# Patient Record
Sex: Female | Born: 1935
Health system: Southern US, Community
[De-identification: ages and names within clinical notes are randomized; demographics above are authoritative.]

## PROBLEM LIST (undated history)

## (undated) DIAGNOSIS — R011 Cardiac murmur, unspecified: Secondary | ICD-10-CM

## (undated) DIAGNOSIS — I5032 Chronic diastolic (congestive) heart failure: Secondary | ICD-10-CM

## (undated) DIAGNOSIS — I10 Essential (primary) hypertension: Secondary | ICD-10-CM

## (undated) DIAGNOSIS — M810 Age-related osteoporosis without current pathological fracture: Secondary | ICD-10-CM

## (undated) HISTORY — DX: Age-related osteoporosis without current pathological fracture: M81.0

## (undated) HISTORY — DX: Chronic diastolic (congestive) heart failure: I50.32

## (undated) HISTORY — PX: CATARACT EXTRACTION: SUR2

---

## 2012-05-10 DIAGNOSIS — I10 Essential (primary) hypertension: Secondary | ICD-10-CM | POA: Diagnosis not present

## 2012-05-10 DIAGNOSIS — M6281 Muscle weakness (generalized): Secondary | ICD-10-CM | POA: Diagnosis not present

## 2012-05-10 DIAGNOSIS — M810 Age-related osteoporosis without current pathological fracture: Secondary | ICD-10-CM | POA: Diagnosis not present

## 2012-05-10 DIAGNOSIS — Z1331 Encounter for screening for depression: Secondary | ICD-10-CM | POA: Diagnosis not present

## 2012-05-10 DIAGNOSIS — Z0000 Encounter for general adult medical examination without abnormal findings: Secondary | ICD-10-CM | POA: Diagnosis not present

## 2012-05-30 DIAGNOSIS — M810 Age-related osteoporosis without current pathological fracture: Secondary | ICD-10-CM | POA: Diagnosis not present

## 2012-08-02 DIAGNOSIS — R29898 Other symptoms and signs involving the musculoskeletal system: Secondary | ICD-10-CM | POA: Diagnosis not present

## 2012-08-02 DIAGNOSIS — Z23 Encounter for immunization: Secondary | ICD-10-CM | POA: Diagnosis not present

## 2012-08-09 DIAGNOSIS — M25559 Pain in unspecified hip: Secondary | ICD-10-CM | POA: Diagnosis not present

## 2012-08-14 DIAGNOSIS — M25559 Pain in unspecified hip: Secondary | ICD-10-CM | POA: Diagnosis not present

## 2012-08-17 DIAGNOSIS — M25559 Pain in unspecified hip: Secondary | ICD-10-CM | POA: Diagnosis not present

## 2012-08-21 DIAGNOSIS — M25559 Pain in unspecified hip: Secondary | ICD-10-CM | POA: Diagnosis not present

## 2012-08-24 DIAGNOSIS — M25559 Pain in unspecified hip: Secondary | ICD-10-CM | POA: Diagnosis not present

## 2012-08-28 DIAGNOSIS — M25559 Pain in unspecified hip: Secondary | ICD-10-CM | POA: Diagnosis not present

## 2012-08-30 DIAGNOSIS — Z23 Encounter for immunization: Secondary | ICD-10-CM | POA: Diagnosis not present

## 2012-08-31 DIAGNOSIS — M25559 Pain in unspecified hip: Secondary | ICD-10-CM | POA: Diagnosis not present

## 2012-09-11 DIAGNOSIS — M25559 Pain in unspecified hip: Secondary | ICD-10-CM | POA: Diagnosis not present

## 2012-09-16 DIAGNOSIS — R5381 Other malaise: Secondary | ICD-10-CM | POA: Diagnosis not present

## 2012-09-16 DIAGNOSIS — R011 Cardiac murmur, unspecified: Secondary | ICD-10-CM | POA: Diagnosis not present

## 2012-09-16 DIAGNOSIS — R0602 Shortness of breath: Secondary | ICD-10-CM | POA: Diagnosis not present

## 2012-09-16 DIAGNOSIS — J189 Pneumonia, unspecified organism: Secondary | ICD-10-CM | POA: Diagnosis not present

## 2012-09-16 DIAGNOSIS — R630 Anorexia: Secondary | ICD-10-CM | POA: Diagnosis not present

## 2012-09-16 DIAGNOSIS — J90 Pleural effusion, not elsewhere classified: Secondary | ICD-10-CM | POA: Diagnosis not present

## 2012-09-18 DIAGNOSIS — R011 Cardiac murmur, unspecified: Secondary | ICD-10-CM | POA: Diagnosis not present

## 2012-09-18 DIAGNOSIS — J189 Pneumonia, unspecified organism: Secondary | ICD-10-CM | POA: Diagnosis not present

## 2012-09-21 ENCOUNTER — Emergency Department (HOSPITAL_COMMUNITY): Payer: Medicare Other

## 2012-09-21 ENCOUNTER — Inpatient Hospital Stay (HOSPITAL_COMMUNITY): Payer: Medicare Other

## 2012-09-21 ENCOUNTER — Inpatient Hospital Stay (HOSPITAL_COMMUNITY)
Admission: EM | Admit: 2012-09-21 | Discharge: 2012-09-24 | DRG: 291 | Disposition: A | Discharge status: Home Health | Payer: Medicare Other | Attending: Internal Medicine | Admitting: Internal Medicine

## 2012-09-21 ENCOUNTER — Encounter (HOSPITAL_COMMUNITY): Payer: Self-pay | Admitting: Nurse Practitioner

## 2012-09-21 DIAGNOSIS — Z79899 Other long term (current) drug therapy: Secondary | ICD-10-CM

## 2012-09-21 DIAGNOSIS — J9600 Acute respiratory failure, unspecified whether with hypoxia or hypercapnia: Secondary | ICD-10-CM | POA: Diagnosis not present

## 2012-09-21 DIAGNOSIS — E876 Hypokalemia: Secondary | ICD-10-CM

## 2012-09-21 DIAGNOSIS — I10 Essential (primary) hypertension: Secondary | ICD-10-CM | POA: Diagnosis not present

## 2012-09-21 DIAGNOSIS — R609 Edema, unspecified: Secondary | ICD-10-CM

## 2012-09-21 DIAGNOSIS — R059 Cough, unspecified: Secondary | ICD-10-CM | POA: Diagnosis not present

## 2012-09-21 DIAGNOSIS — J90 Pleural effusion, not elsewhere classified: Secondary | ICD-10-CM | POA: Diagnosis not present

## 2012-09-21 DIAGNOSIS — J189 Pneumonia, unspecified organism: Secondary | ICD-10-CM | POA: Diagnosis not present

## 2012-09-21 DIAGNOSIS — I5031 Acute diastolic (congestive) heart failure: Principal | ICD-10-CM | POA: Diagnosis present

## 2012-09-21 DIAGNOSIS — I059 Rheumatic mitral valve disease, unspecified: Secondary | ICD-10-CM | POA: Diagnosis not present

## 2012-09-21 DIAGNOSIS — I509 Heart failure, unspecified: Secondary | ICD-10-CM | POA: Diagnosis present

## 2012-09-21 DIAGNOSIS — J984 Other disorders of lung: Secondary | ICD-10-CM | POA: Diagnosis not present

## 2012-09-21 DIAGNOSIS — R05 Cough: Secondary | ICD-10-CM | POA: Diagnosis not present

## 2012-09-21 DIAGNOSIS — J9819 Other pulmonary collapse: Secondary | ICD-10-CM | POA: Diagnosis not present

## 2012-09-21 HISTORY — DX: Cardiac murmur, unspecified: R01.1

## 2012-09-21 HISTORY — DX: Essential (primary) hypertension: I10

## 2012-09-21 LAB — CBC
HCT: 35.1 % — ABNORMAL LOW (ref 36.0–46.0)
HCT: 33.2 % — ABNORMAL LOW (ref 36.0–46.0)
Hemoglobin: 11.5 g/dL — ABNORMAL LOW (ref 12.0–15.0)
MCHC: 34.6 g/dL (ref 30.0–36.0)
MCV: 88 fL (ref 78.0–100.0)
RBC: 3.99 MIL/uL (ref 3.87–5.11)
RDW: 13.8 % (ref 11.5–15.5)
WBC: 8.5 10*3/uL (ref 4.0–10.5)
WBC: 8.2 10*3/uL (ref 4.0–10.5)

## 2012-09-21 LAB — CREATININE, SERUM
Creatinine, Ser: 0.74 mg/dL (ref 0.50–1.10)
GFR calc Af Amer: 85 mL/min — ABNORMAL LOW (ref >90)
GFR calc non Af Amer: 73 mL/min — ABNORMAL LOW (ref >90)

## 2012-09-21 LAB — URINALYSIS, ROUTINE W REFLEX MICROSCOPIC
Bilirubin Urine: NEGATIVE
Glucose, UA: NEGATIVE mg/dL
Hgb urine dipstick: NEGATIVE
Nitrite: NEGATIVE
Specific Gravity, Urine: 1.007 (ref 1.005–1.030)
pH: 7 (ref 5.0–8.0)

## 2012-09-21 LAB — BASIC METABOLIC PANEL
BUN: 19 mg/dL (ref 6–23)
CO2: 21 mEq/L (ref 19–32)
Chloride: 101 mEq/L (ref 96–112)
Creatinine, Ser: 0.71 mg/dL (ref 0.50–1.10)
Glucose, Bld: 128 mg/dL — ABNORMAL HIGH (ref 70–99)

## 2012-09-21 MED ORDER — ACETAMINOPHEN 325 MG PO TABS: 650.0000 mg | ORAL_TABLET | ORAL | Status: DC | PRN

## 2012-09-21 MED ORDER — ATORVASTATIN CALCIUM 40 MG PO TABS
40.0000 mg | ORAL_TABLET | Freq: Every day | ORAL | Status: DC
  Administered 2012-09-21 – 2012-09-24 (×4): 40 mg via ORAL
  Filled 2012-09-21 (×4): qty 1

## 2012-09-21 MED ORDER — IRBESARTAN 150 MG PO TABS
150.0000 mg | ORAL_TABLET | Freq: Every day | ORAL | Status: DC
  Administered 2012-09-21 – 2012-09-24 (×4): 150 mg via ORAL
  Filled 2012-09-21 (×4): qty 1

## 2012-09-21 MED ORDER — MOXIFLOXACIN HCL 400 MG PO TABS
400.0000 mg | ORAL_TABLET | Freq: Every day | ORAL | Status: AC
  Administered 2012-09-21: 400 mg via ORAL
  Filled 2012-09-21: qty 1

## 2012-09-21 MED ORDER — ASPIRIN EC 81 MG PO TBEC
81.0000 mg | DELAYED_RELEASE_TABLET | Freq: Every day | ORAL | Status: DC
  Administered 2012-09-21 – 2012-09-24 (×4): 81 mg via ORAL
  Filled 2012-09-21 (×4): qty 1

## 2012-09-21 MED ORDER — SODIUM CHLORIDE 0.9 % IJ SOLN: 3.0000 mL | INTRAMUSCULAR | Status: DC | PRN

## 2012-09-21 MED ORDER — ONDANSETRON HCL 4 MG/2ML IJ SOLN: 4.0000 mg | Freq: Four times a day (QID) | INTRAMUSCULAR | Status: DC | PRN

## 2012-09-21 MED ORDER — HEPARIN SODIUM (PORCINE) 5000 UNIT/ML IJ SOLN
5000.0000 [IU] | Freq: Three times a day (TID) | INTRAMUSCULAR | Status: DC
  Administered 2012-09-21 – 2012-09-24 (×9): 5000 [IU] via SUBCUTANEOUS
  Filled 2012-09-21 (×11): qty 1

## 2012-09-21 MED ORDER — SODIUM CHLORIDE 0.9 % IJ SOLN
3.0000 mL | Freq: Two times a day (BID) | INTRAMUSCULAR | Status: DC
  Administered 2012-09-21 – 2012-09-24 (×6): 3 mL via INTRAVENOUS

## 2012-09-21 MED ORDER — FUROSEMIDE 10 MG/ML IJ SOLN
40.0000 mg | Freq: Two times a day (BID) | INTRAMUSCULAR | Status: DC
  Administered 2012-09-21 – 2012-09-22 (×2): 40 mg via INTRAVENOUS
  Filled 2012-09-21 (×4): qty 4

## 2012-09-21 MED ORDER — CARVEDILOL 3.125 MG PO TABS
3.1250 mg | ORAL_TABLET | Freq: Two times a day (BID) | ORAL | Status: DC
  Administered 2012-09-22 – 2012-09-23 (×3): 3.125 mg via ORAL
  Filled 2012-09-21 (×4): qty 1

## 2012-09-21 MED ORDER — IOHEXOL 350 MG/ML SOLN
80.0000 mL | Freq: Once | INTRAVENOUS | Status: AC | PRN
  Administered 2012-09-21: 80 mL via INTRAVENOUS

## 2012-09-21 MED ORDER — FUROSEMIDE 10 MG/ML IJ SOLN
40.0000 mg | Freq: Once | INTRAMUSCULAR | Status: AC
  Administered 2012-09-21: 40 mg via INTRAVENOUS
  Filled 2012-09-21: qty 4

## 2012-09-21 MED ORDER — SODIUM CHLORIDE 0.9 % IV SOLN: 250.0000 mL | INTRAVENOUS | Status: DC | PRN

## 2012-09-21 NOTE — ED Notes (Signed)
Pt requesting foley catheter, too weak to get to [facility] or lift for a bedpan

## 2012-09-21 NOTE — ED Provider Notes (Signed)
History     CSN: [id number]  Arrival date & time 09/21/12  1304   First MD Initiated Contact with Patient 09/21/12 1323      Chief Complaint  Patient presents with  . Pneumonia    (Consider location/radiation/quality/duration/timing/severity/associated sxs/prior treatment) HPI Pt presents with c/o shortness of breath.  This has been worsening over the past week.  She was seen by her PMD and diagnosed with pneumonia- started on antibiotics- she has been taking meds as prescribed, but today shortness of breath much worse.  She has felt fatigued with decreased appetite.  SOB worse with exertion and lying flat.  No fever/chills.  Has not been drinking much liquids over past several days as well.  Denies chest pain.  There are no other associated systemic symptoms, there are no other alleviating or modifying factors.   Past Medical History  Diagnosis Date  . Heart murmur   . Hypertension     History reviewed. No pertinent past surgical history.  Family History  Problem Relation Age of Onset  . Dementia Mother   . Lung cancer Father     History  Substance Use Topics  . Smoking status: Never Smoker   . Smokeless tobacco: Not on file  . Alcohol Use: No    OB History    Grav Para Term Preterm Abortions TAB SAB Ect Mult Living                  Review of Systems ROS reviewed and all otherwise negative except for mentioned in HPI  Allergies  Review of patient's allergies indicates no known allergies.  Home Medications   Current Outpatient Rx  Name Route Sig Dispense Refill  . CALCIUM 1200 PO Oral Take 1,200 mg by mouth daily.    Marland Kitchen VITAMIN D 1000 UNITS PO TABS Oral Take 1,000 Units by mouth daily.    Marland Kitchen MOXIFLOXACIN HCL 400 MG PO TABS Oral Take 400 mg by mouth daily. For 10 days starting on 09/17/12    . ADULT MULTIVITAMIN W/MINERALS CH Oral Take 1 tablet by mouth daily.    . TELMISARTAN 80 MG PO TABS Oral Take 80 mg by mouth daily.    . ASPIRIN 81 MG PO TBEC Oral Take  1 tablet (81 mg total) by mouth daily.    Marland Kitchen BENZONATATE 100 MG PO CAPS Oral Take 1 capsule (100 mg total) by mouth 3 (three) times daily as needed for cough. 20 capsule 0  . FUROSEMIDE 20 MG PO TABS Oral Take 1 tablet (20 mg total) by mouth daily. 30 tablet 0  . POTASSIUM CHLORIDE ER 10 MEQ PO TBCR Oral Take 2 tablets (20 mEq total) by mouth daily. 30 tablet 0    BP 126/62  Pulse 93  Temp 98.1 F (36.7 C) (Oral)  Resp 19  Ht 4\' 5"  (1.346 m)  Wt 115 lb 8 oz (52.39 kg)  BMI 28.91 kg/m2  SpO2 97% Vitals reviewed Physical Exam Physical Examination: General appearance - alert, tired appearing appearing, and in no distress Mental status - alert, oriented to person, place, and time Eyes - pupils equal and reactive, no conjunctival injection, no scleral icterus Mouth - mucous membranes tacky, no OP lesions Chest - clear to auscultation, no wheezes, rales or rhonchi, symmetric air entry Heart - normal rate, regular rhythm, normal S1, S2, no murmurs, rubs, clicks or gallops Abdomen - soft, nontender, nondistended, no masses or organomegaly Extremities - peripheral pulses normal, no pedal edema, no clubbing or cyanosis  Skin - normal coloration and turgor, no rashes  ED Course  Procedures (including critical care time)  3:47 PM  D/w [facility] hospital[facility] for admission- tele bed, team 4.    Labs Reviewed  PRO B NATRIURETIC PEPTIDE - Abnormal; Notable for the following:    Pro B Natriuretic peptide (BNP) 1973.0 (*)     All other components within normal limits  CBC - Abnormal; Notable for the following:    Hemoglobin 11.8 (*)     HCT 35.1 (*)     All other components within normal limits  BASIC METABOLIC PANEL - Abnormal; Notable for the following:    Sodium 133 (*)     Glucose, Bld 128 (*)     GFR calc non Af Amer 74 (*)     GFR calc Af Amer 86 (*)     All other components within normal limits  D-DIMER, QUANTITATIVE - Abnormal; Notable for the following:    D-Dimer, Quant 1.61 (*)      All other components within normal limits  BASIC METABOLIC PANEL - Abnormal; Notable for the following:    Potassium 3.3 (*)  DELTA CHECK NOTED   Glucose, Bld 102 (*)     Calcium 8.3 (*)     GFR calc non Af Amer 72 (*)     GFR calc Af Amer 83 (*)     All other components within normal limits  CBC - Abnormal; Notable for the following:    RBC 3.78 (*)     Hemoglobin 11.5 (*)     HCT 33.2 (*)     All other components within normal limits  CREATININE, SERUM - Abnormal; Notable for the following:    GFR calc non Af Amer 73 (*)     GFR calc Af Amer 85 (*)     All other components within normal limits  VITAMIN B12 - Abnormal; Notable for the following:    Vitamin B-12 1444 (*)     All other components within normal limits  BASIC METABOLIC PANEL - Abnormal; Notable for the following:    Potassium 2.9 (*)     Glucose, Bld 113 (*)     BUN 26 (*)     Calcium 7.8 (*)     GFR calc non Af Amer 59 (*)     GFR calc Af Amer 68 (*)     All other components within normal limits  CBC - Abnormal; Notable for the following:    RBC 3.83 (*)     Hemoglobin 11.3 (*)     HCT 33.8 (*)     All other components within normal limits  BASIC METABOLIC PANEL - Abnormal; Notable for the following:    Glucose, Bld 112 (*)     BUN 24 (*)     Calcium 8.2 (*)     GFR calc non Af Amer 72 (*)     GFR calc Af Amer 83 (*)     All other components within normal limits  BASIC METABOLIC PANEL - Abnormal; Notable for the following:    Glucose, Bld 111 (*)     Calcium 8.1 (*)     GFR calc non Af Amer 60 (*)     GFR calc Af Amer 69 (*)     All other components within normal limits  CBC - Abnormal; Notable for the following:    Hemoglobin 11.9 (*)     All other components within normal limits  URINALYSIS, ROUTINE W REFLEX MICROSCOPIC  URINE CULTURE  POCT I-STAT TROPONIN I  TSH  TROPONIN I  TROPONIN I  TROPONIN I  LAB REPORT - SCANNED   No results found.   1. Acute respiratory failure   2. HTN  (hypertension)   3. Edema   4. Hypokalemia       MDM  Pt with sob and fatigue worsening over the past week.  cxr shows bilateral pleural effusion, BNP elevated. Pt without hx of CHF.  Doubt pneumonia in this case.  Given lasix 40mg  IV.  Admitted to [facility] for further management.          Ethelda Chick, MD 09/27/12 [phone]

## 2012-09-21 NOTE — H&P (Addendum)
[facility] History and Physical  Andrea [name] ZOX:[id number] DOB: 1920-02-27 DOA: 09/21/2012  Referring physician: Dr. Karma [name] PCP: Andrea Showers, MD    Chief Complaint: Dyspnea  HPI: Andrea [name] is a 78 y.o. female  There is an 78 year old female with past medical history of hypertension that comes in for dyspnea that started 5 days prior to admission. She relates she wants to an urgent care chest x-ray was done and was.nose with pneumonia she was started on azithromycin empirically. Did not improve went to see her primary care Dr. Verl [name] her on Avelox she progressively got worse to the point where she couldn't walk. So she decided to come to the emergency room. She also been complaining of swelling of the feet since Saturday. She denies any fever, or weight loss, no chest pain nausea vomiting or diarrhea. No recent viral. She relates no night sweats infections.  Review of Systems: The patient denies anorexia, fever, weight loss,, vision loss, decreased hearing, hoarseness, chest pain, syncope, dyspnea on exertion,  balance deficits, hemoptysis, abdominal pain, melena, hematochezia, severe indigestion/heartburn, hematuria, incontinence, genital sores, muscle weakness, suspicious skin lesions, transient blindness, difficulty walking, depression, unusual weight change, abnormal bleeding, enlarged lymph nodes, angioedema, and breast masses.    Past Medical History  Diagnosis Date  . Heart murmur   . Hypertension    History reviewed. No pertinent past surgical history. Social History:  reports that she has never smoked. She does not have any smokeless tobacco history on file. She reports that she does not drink alcohol or use illicit drugs. Is at home by herself can perform all her ADLs  No Known Allergies  Family History  Problem Relation Age of Onset  . Dementia Mother   . Lung cancer Father     Prior to Admission medications   Medication Sig Start Date End Date Taking?  Authorizing Provider  Calcium Carbonate-Vit D-Min (CALCIUM 1200 PO) Take 1,200 mg by mouth daily.   Yes Historical Provider, MD  cholecalciferol (VITAMIN D) 1000 UNITS tablet Take 1,000 Units by mouth daily.   Yes Historical Provider, MD  guaiFENesin (MUCINEX) 600 MG 12 hr tablet Take 600 mg by mouth 2 (two) times daily as needed.   Yes Historical Provider, MD  moxifloxacin (AVELOX) 400 MG tablet Take 400 mg by mouth daily. For 10 days starting on 09/17/12   Yes Historical Provider, MD  Multiple Vitamin (MULTIVITAMIN WITH MINERALS) TABS Take 1 tablet by mouth daily.   Yes Historical Provider, MD  naproxen sodium (ANAPROX) 220 MG tablet Take 220 mg by mouth 2 (two) times daily as needed. For pain   Yes Historical Provider, MD  telmisartan (MICARDIS) 80 MG tablet Take 80 mg by mouth daily.   Yes Historical Provider, MD   Physical Exam: Filed Vitals:   09/21/12 1314 09/21/12 1459  BP: 129/73 121/68  Pulse: 103 94  Temp: 97.5 F (36.4 C)   TempSrc: Oral   Resp: 22 18  SpO2: 95% 94%     General:  Awake alert and oriented x3  Eyes: Anicteric pupils equally round reactive to light  ENT: Moist mucous member  Neck: Minimal JVD positive hepatojugular reflux  Cardiovascular: Regular rate and rhythm she has a systolic ejection murmur in the aortic area which radiates to the fifth intercostal space.  Respiratory: She has moderate air movement with crackles at bases and no appreciated sounds in the lower lung base  Abdomen: Positive bowel sounds nontender nondistended and soft  Skin: No rashes ulcerations 3+ edema  Musculoskeletal: No joint deformities  Psychiatric: Appropriate but hard of hearing  Neurologic: Awake alert and oriented x3 coherent for language  Labs on Admission:  Basic Metabolic Panel:  Lab 09/21/12 4098  NA 133*  K 4.0  CL 101  CO2 21  GLUCOSE 128*  BUN 19  CREATININE 0.71  CALCIUM 8.7  MG --  PHOS --   Liver Function Tests: No results found for this  basename: AST:5,ALT:5,ALKPHOS:5,BILITOT:5,PROT:5,ALBUMIN:5 in the last 168 hours No results found for this basename: LIPASE:5,AMYLASE:5 in the last 168 hours No results found for this basename: AMMONIA:5 in the last 168 hours CBC:  Lab 09/21/12 1324  WBC 8.5  NEUTROABS --  HGB 11.8*  HCT 35.1*  MCV 88.0  PLT 266   Cardiac Enzymes: No results found for this basename: CKTOTAL:5,CKMB:5,CKMBINDEX:5,TROPONINI:5 in the last 168 hours  BNP (last 3 results)  Basename 09/21/12 1323  PROBNP 1973.0*   CBG: No results found for this basename: GLUCAP:5 in the last 168 hours  Radiological Exams on Admission: Dg Chest 2 View  09/21/2012  *RADIOLOGY REPORT*  Clinical Data: Cough, pneumonia.  CHEST - 2 VIEW  Comparison: None.  Findings: Mild cardiomegaly is noted.  Moderate bilateral pleural effusions are noted with possible underlying pneumonia or atelectasis.  Faint right upper lobe opacity is noted which may represent pneumonia.  IMPRESSION: Moderate bilateral pleural effusions with possible underlying pneumonia atelectasis.  Faint right upper lobe opacity which may represent pneumonia; follow-up radiographs are recommended to ensure resolution and rule out possible underlying neoplasm or malignancy.   Original Report Authenticated By: Venita Sheffield., M.D.     EKG: Independently reviewed. Pending  Assessment/Plan Principal Problem: Acute respiratory failure: -78 year-old with only past medical history of hypertension, her BNP is elevated her first set of cardiac enzymes is negative, and EKG was not done by the emergency room. We'll continue her on  Oxygen. Chest x-ray showed bilateral pleural effusions and she has minimal JVD positive hepatojugular reflux and by basilar crackles on her physical exam. I am concern for acute decompensated heart failure. We'll place her on a restricted fluid diet, low salt diet. Check an EKG cycle her cardiac enzymes start her on Lasix IV twice a day I will  continue her on her ACE we'll start her on the beta blocker tomorrow morning. Also start her on a statin. We'll get a 2-D echo. Her d-dimer was positive stool go ahead and get CT image of the chest to rule out a PE low probability for PE. As she is not tachycardic. We'll check a TSH. We'll check labs in the morning. Check a vitamin B 12 level  -She was recently treated with Avelox, she does have an infiltrate on chest x-ray, though she did have a pneumonia at that time he would take up to 6 weeks to clear on chest x-ray. She has no increase in her white blood cell count, no fever and minimal cough. We'll go ahead and finish her course of antibiotics in the hospital orally.  HTN (hypertension): -Blood pressure seems to be well controlled, we'll continue lisinopril at low dose beta blocker tomorrow morning.  Family Communication: [phone] Disposition Plan: To be determined Time spent: 65 minutes  Marinda Elk [facility] Pager [phone]  If 7PM-7AM, please contact night-coverage [email or web address] Password [email or web address] 09/21/2012, 4:08 PM

## 2012-09-21 NOTE — ED Notes (Signed)
Per son: pt not feeling well and SOB since last week. Pt was seen at [facility] Saturday and started on abx for pneumonia, on Monday PCP switched her to avelox which she has been taking as prescribed. Today SOB increased severely and family reports poor intake, concerned for deyhdration. Pt states SOB especially with exertion and when lying flat, and states she "just doesn't feel well." A&Ox4, able to speak in full sentences but mild labored

## 2012-09-21 NOTE — Plan of Care (Signed)
Problem: Phase I Progression Outcomes Goal: Pain controlled Outcome: Completed/Met Date Met:  09/21/12 Pt has no c/o pain, goal met Goal: Other Phase II Outcomes/Goals Outcome: Progressing Pt O2 wnl on 3L, pt has non productive cough, pt oob with assist x 1, abx ordered, will continue to monitor

## 2012-09-22 DIAGNOSIS — I059 Rheumatic mitral valve disease, unspecified: Secondary | ICD-10-CM

## 2012-09-22 DIAGNOSIS — R609 Edema, unspecified: Secondary | ICD-10-CM

## 2012-09-22 DIAGNOSIS — E876 Hypokalemia: Secondary | ICD-10-CM

## 2012-09-22 LAB — TSH: TSH: 2.975 u[IU]/mL (ref 0.350–4.500)

## 2012-09-22 LAB — URINE CULTURE

## 2012-09-22 LAB — BASIC METABOLIC PANEL
CO2: 26 mEq/L (ref 19–32)
Calcium: 8.3 mg/dL — ABNORMAL LOW (ref 8.4–10.5)
Chloride: 100 mEq/L (ref 96–112)
Creatinine, Ser: 0.79 mg/dL (ref 0.50–1.10)
GFR calc Af Amer: 83 mL/min — ABNORMAL LOW (ref >90)
Sodium: 139 mEq/L (ref 135–145)

## 2012-09-22 LAB — TROPONIN I: Troponin I: <0.3 ng/mL (ref <0.30)

## 2012-09-22 MED ORDER — ENSURE COMPLETE PO LIQD
237.0000 mL | ORAL | Status: DC
  Administered 2012-09-22 – 2012-09-24 (×3): 237 mL via ORAL

## 2012-09-22 MED ORDER — FUROSEMIDE 20 MG PO TABS
20.0000 mg | ORAL_TABLET | Freq: Every day | ORAL | Status: DC
  Administered 2012-09-22 – 2012-09-24 (×3): 20 mg via ORAL
  Filled 2012-09-22 (×3): qty 1

## 2012-09-22 NOTE — Progress Notes (Signed)
INITIAL ADULT NUTRITION ASSESSMENT Date: 09/22/2012   Time: 10:59 AM Reason for Assessment: MST (Malnutrition Screening Tool)   INTERVENTION: 1. Ensure Complete po daily, each supplement provides 350 kcal and 13 grams of protein.  2. RD will continue to follow     DOCUMENTATION CODES Per approved criteria  -Not Applicable    ASSESSMENT: Female 78 y.o.  Dx: Acute respiratory failure  Hx:  Past Medical History  Diagnosis Date  . Heart murmur   . Hypertension     History reviewed. No pertinent past surgical history.   Related Meds:     . aspirin EC  81 mg Oral Daily  . atorvastatin  40 mg Oral q1800  . carvedilol  3.125 mg Oral BID WC  . furosemide  40 mg Intravenous Once  . furosemide  40 mg Intravenous BID  . heparin  5,000 Units Subcutaneous Q8H  . irbesartan  150 mg Oral Daily  . moxifloxacin  400 mg Oral Daily  . sodium chloride  3 mL Intravenous Q12H     Ht: 4\' 5"  (134.6 cm)  Wt: 113 lb 4.8 oz (51.393 kg) (scale a)  Ideal Wt: 40 kg  % Ideal Wt: 128%  Usual Wt: 115 lbs per pt son Wt Readings from Last 5 Encounters:  09/22/12 113 lb 4.8 oz (51.393 kg)    % Usual Wt: 99%  Body mass index is 28.36 kg/(m^2). Pt is overweight per current BMI   Food/Nutrition Related Hx: pt reports no appetite x 1 week and possible weight loss  Labs:  CMP     Component Value Date/Time   NA 139 09/22/2012 0520   K 3.3* 09/22/2012 0520   CL 100 09/22/2012 0520   CO2 26 09/22/2012 0520   GLUCOSE 102* 09/22/2012 0520   BUN 18 09/22/2012 0520   CREATININE 0.79 09/22/2012 0520   CALCIUM 8.3* 09/22/2012 0520   GFRNONAA 72* 09/22/2012 0520   GFRAA 83* 09/22/2012 0520      Intake/Output Summary (Last 24 hours) at 09/22/12 1100 Last data filed at 09/22/12 0940  Gross per 24 hour  Intake    450 ml  Output   3850 ml  Net  -3400 ml      Diet Order: Cardiac  Supplements/Tube Feeding: none   IVF:    Estimated Nutritional Needs:   Kcal: 1150-1350 Protein:  50-60 gm  Fluid:  1000 ml fluids per diet restriction   Pt admitted with dyspnea, some extra fluids on board.  Per pt's son, pt was not eating anything for almost a week, and had very little fluids either. He reports her weight was trending up. Likely r/t increased fluid volume. Now with negative fluid balance, weight is decreasing.  Pt was started on Ensure daily by her son, would like to continue this. Appetite is much improved with fluids off. Expect good po intake.  NUTRITION DIAGNOSIS: -Inadequate oral intake (NI-2.1).  Status: resolving  RELATED TO: poor appetite  AS EVIDENCE BY: presumed weight loss  MONITORING/EVALUATION(Goals): Goal: PO intake to meet >90% estimated nutrition needs  Monitor: PO intake, weight, labs  EDUCATION NEEDS: -No education needs identified at this time    Clarene Duke RD, LDN Pager [phone] After Hours pager [phone]  09/22/2012, 10:59 AM

## 2012-09-22 NOTE — Progress Notes (Signed)
Occupational Therapy Evaluation Patient Details Name: Andrea [name] MRN: [id number] DOB: [birth year] Today's Date: 09/22/2012 Time: 4098-1191 OT Time Calculation (min): 37 min  OT Assessment / Plan / Recommendation Clinical Impression  Pleasant 78 yo admitted with SOB.  Hx of CHF. PTA, pt lived independently alone with son next door who checks on her often. At rest, Pt O2 sats 94 1 L. Ambulated RA x 76ft. Desat to 88, but quidkly rebounded to 93. Feel pt will be able to D/C home with initial 24 hour supervision of son, then intermittent supervision. Pt will benefit from [facility] services. Will follow acutely.    OT Assessment  Patient needs continued OT Services    Follow Up Recommendations  Home health OT    Barriers to Discharge None    Equipment Recommendations  Tub/shower seat    Recommendations for Other Services    Frequency  Min 2X/week    Precautions / Restrictions Precautions Precautions: None Restrictions Weight Bearing Restrictions: No   Pertinent Vitals/Pain No c/o pain. 94 sitting 1 L O2. O2 sats 88 RA with ambulation. Min dyspnea.    ADL  Eating/Feeding: Performed;Independent Where Assessed - Eating/Feeding: Chair Grooming: Performed;Wash/dry hands;Modified independent Where Assessed - Grooming: Unsupported standing Upper Body Bathing: Simulated;Set up Where Assessed - Upper Body Bathing: Unsupported sitting Lower Body Bathing: Simulated;Supervision/safety;Set up Where Assessed - Lower Body Bathing: Unsupported sit to stand Upper Body Dressing: Simulated;Set up Where Assessed - Upper Body Dressing: Unsupported sitting Lower Body Dressing: Simulated;Minimal assistance Where Assessed - Lower Body Dressing: Unsupported sit to stand Toilet Transfer: Performed;Supervision/safety Toilet Transfer Method: Sit to stand;Stand pivot Acupuncturist: Comfort height toilet Toileting - Clothing Manipulation and Hygiene: Performed;Modified independent Where  Assessed - Toileting Clothing Manipulation and Hygiene: Standing Tub/Shower Transfer: Landscape architect Method: Science writer: Other (comment) (seat) Equipment Used: Gait belt;Rolling walker Transfers/Ambulation Related to ADLs: S. Ambualte RW level on RA71ft. No LOB noted.  ADL Comments: States it is more difficult to donn/doff socks    OT Diagnosis: Generalized weakness  OT Problem List: Decreased strength;Decreased activity tolerance;Decreased knowledge of use of DME or AE;Cardiopulmonary status limiting activity OT Treatment Interventions: Self-care/ADL training;Therapeutic exercise;Energy conservation;DME and/or AE instruction;Therapeutic activities;Patient/family education   OT Goals Acute Rehab OT Goals OT Goal Formulation: With patient Time For Goal Achievement: 10/06/12 Potential to Achieve Goals: Good ADL Goals Pt Will Perform Grooming: Unsupported;Standing at sink;with modified independence ADL Goal: Grooming - Progress: Goal set today Pt Will Perform Upper Body Bathing: with supervision;Sit to stand from chair;Sitting at sink;Unsupported ADL Goal: Upper Body Bathing - Progress: Goal set today Pt Will Perform Lower Body Bathing: with supervision;Standing at sink;Sitting at sink;Sit to stand from chair;Unsupported ADL Goal: Lower Body Bathing - Progress: Goal set today Pt Will Transfer to Toilet: with modified independence;Ambulation;with DME ADL Goal: Toilet Transfer - Progress: Goal set today Additional ADL Goal #1: Pt will verbalize 2 E conservation techniques to use at home. ADL Goal: Additional Goal #1 - Progress: Goal set today  Visit Information  Last OT Received On: 09/22/12    Subjective Data  Subjective: i have 5 cats I have to take care of   Prior Functioning     Home Living Lives With: Alone Available Help at Discharge: Family;Friend(s);Available 24 hours/day Type of Home: House Home Access:  Stairs to enter [employer] of Steps: 3 Entrance Stairs-Rails: Right;Left Home Layout: Two level;Full bath on main level Bathroom Shower/Tub: Walk-in shower;Door Bathroom Toilet: Handicapped height Bathroom Accessibility: Yes How Accessible: Accessible  via walker Home Adaptive Equipment: Straight cane Additional Comments: lives next door to son who assists as needed Prior Function Level of Independence: Independent;Independent with assistive device(s) (occasionally uses cane) Able to Take Stairs?: Yes Driving: No 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 Dominant Hand: Right         Vision/Perception  reading glasses   Cognition  Overall Cognitive Status: Appears within functional limits for tasks assessed/performed Arousal/Alertness: Awake/alert Orientation Level: Appears intact for tasks assessed Behavior During Session: [facility] for tasks performed    Extremity/Trunk Assessment Right Upper Extremity Assessment RUE ROM/Strength/Tone: Deficits;Due to pain (due to arthritis) RUE ROM/Strength/Tone Deficits: general wakness. limited shoulder AROm premorbidly but functional Left Upper Extremity Assessment LUE ROM/Strength/Tone: Deficits LUE ROM/Strength/Tone Deficits: general weakness. hx of arthritis. ? RTC weakness Right Lower Extremity Assessment RLE ROM/Strength/Tone:  (general weakness) Left Lower Extremity Assessment LLE ROM/Strength/Tone:  (general weakness) Trunk Assessment Trunk Assessment: Normal     Mobility Bed Mobility Bed Mobility: Supine to Sit;Sit to Supine Supine to Sit: 6: Modified independent (Device/Increase time) Sit to Supine: 6: Modified independent (Device/Increase time) Transfers Transfers: Sit to Stand;Stand to Sit Sit to Stand: 5: Supervision;With upper extremity assist;From bed Stand to Sit: 5: Supervision;With upper extremity assist;To chair/3-in-1 Details for Transfer Assistance: good awareness of safety     Shoulder  Instructions     Exercise     Balance Balance Balance Assessed:  (appeasrs WFL for ADL)   End of Session OT - End of Session Equipment Utilized During Treatment: Gait belt Activity Tolerance: Patient tolerated treatment well Patient left: in chair;with call bell/phone within reach Nurse Communication: Mobility status;Precautions  GO     Corin Formisano,HILLARY 09/22/2012, 3:51 PM [facility], OTR/L  [phone] 09/22/2012

## 2012-09-22 NOTE — Progress Notes (Signed)
[facility]  Patient Status: New  [facility] is providing the following services: RN  If patient discharges after hours, please call [phone].   Jodene Nam 09/22/2012, 2:02 PM

## 2012-09-22 NOTE — Progress Notes (Signed)
[facility] PROGRESS NOTE  Andrea [name] ZOX:[id number] DOB: 05-07-1920 DOA: 09/21/2012 PCP: Elby Showers, MD  Assessment/Plan Acute respiratory failure:  -78 year-old with only past medical history of hypertension etiology unclear. BNP is elevated, first set of cardiac enzymes is negative.  Chest x-ray showed bilateral pleural effusions. CT chest neg for PE Concern for CHF. -She was recently treated with Avelox, she does have an infiltrate on chest x-ray, though she did have a pneumonia at that time he would take up to 6 weeks to clear on chest x-ray. She has no increase in her white blood cell count, no fever and minimal cough. Started on IV lasix and in 3L neg. Will continue lasix and low sod diet. Echo results pending. Cycle CE HTN (hypertension):  -Blood pressure seems to be well controlled, we'll continue lisinopril. start low dose beta blocker today.     Code Status:  Family Communication: pt at bedside Disposition Plan: home when stable   Consultants:  none  Procedures:  none  Antibiotics:  Avelox  HPI/Subjective: Ambulating in room. Gait slow but steady. Denies pain/discomfort.  Objective: Filed Vitals:   09/21/12 1715 09/21/12 2046 09/21/12 2105 09/22/12 0452  BP: 131/62 111/51 142/42 124/69  Pulse: 90 90 94 88  Temp: 98 F (36.7 C) 97.8 F (36.6 C) 98.2 F (36.8 C) 98.1 F (36.7 C)  TempSrc: Oral Oral Oral Oral  Resp: 20 18 22 20   Height: 4\' 5"  (1.346 m)     Weight: 52.663 kg (116 lb 1.6 oz)   51.393 kg (113 lb 4.8 oz)  SpO2: 99% 96% 97% 98%    Intake/Output Summary (Last 24 hours) at 09/22/12 1150 Last data filed at 09/22/12 0940  Gross per 24 hour  Intake    450 ml  Output   3850 ml  Net  -3400 ml   Filed Weights   09/21/12 1715 09/22/12 0452  Weight: 52.663 kg (116 lb 1.6 oz) 51.393 kg (113 lb 4.8 oz)    Exam:   General:  Somewhat frail appearing. NAD  Cardiovascular: RRR. +murmur. Trace LEE PPP  Respiratory: normal effort.  BS distant. Fine crackles bases  Abdomen: soft non-tender. +BS  Data Reviewed: Basic Metabolic Panel:  Lab 09/22/12 8119 09/21/12 1912 09/21/12 1324  NA 139 -- 133*  K 3.3* -- 4.0  CL 100 -- 101  CO2 26 -- 21  GLUCOSE 102* -- 128*  BUN 18 -- 19  CREATININE 0.79 0.74 0.71  CALCIUM 8.3* -- 8.7  MG -- -- --  PHOS -- -- --   Liver Function Tests: No results found for this basename: AST:5,ALT:5,ALKPHOS:5,BILITOT:5,PROT:5,ALBUMIN:5 in the last 168 hours No results found for this basename: LIPASE:5,AMYLASE:5 in the last 168 hours No results found for this basename: AMMONIA:5 in the last 168 hours CBC:  Lab 09/21/12 1912 09/21/12 1324  WBC 8.2 8.5  NEUTROABS -- --  HGB 11.5* 11.8*  HCT 33.2* 35.1*  MCV 87.8 88.0  PLT 264 266   Cardiac Enzymes: No results found for this basename: CKTOTAL:5,CKMB:5,CKMBINDEX:5,TROPONINI:5 in the last 168 hours BNP (last 3 results)  Basename 09/21/12 1323  PROBNP 1973.0*   CBG: No results found for this basename: GLUCAP:5 in the last 168 hours  No results found for this or any previous visit (from the past 240 hour(s)).   Studies: Dg Chest 2 View  09/21/2012  *RADIOLOGY REPORT*  Clinical Data: Cough, pneumonia.  CHEST - 2 VIEW  Comparison: None.  Findings: Mild cardiomegaly is noted.  Moderate bilateral pleural  effusions are noted with possible underlying pneumonia or atelectasis.  Faint right upper lobe opacity is noted which may represent pneumonia.  IMPRESSION: Moderate bilateral pleural effusions with possible underlying pneumonia atelectasis.  Faint right upper lobe opacity which may represent pneumonia; follow-up radiographs are recommended to ensure resolution and rule out possible underlying neoplasm or malignancy.   Original Report Authenticated By: Venita Sheffield., M.D.    Ct Angio Chest Pe W/cm &/or Wo Cm  09/21/2012  *RADIOLOGY REPORT*  Clinical Data: Shortness of breath.  Elevated D-dimer.  CT ANGIOGRAPHY CHEST  Technique:   Multidetector CT imaging of the chest using the standard protocol during bolus administration of intravenous contrast. Multiplanar reconstructed images including MIPs were obtained and reviewed to evaluate the vascular anatomy.  Contrast: 80mL OMNIPAQUE IOHEXOL 350 MG/ML SOLN  Comparison: None.  Findings: Satisfactory opacification of the pulmonary arteries noted, and there is no evidence of pulmonary emboli.  The no evidence of thoracic aortic aneurysm.  No evidence of mediastinal mass.  No lymphadenopathy seen within the thorax.  Moderate bilateral pleural effusions are seen with compressive atelectasis in the lung bases.  There is ill-defined airspace opacity in the right upper lobe, suspicious for pneumonia.  No central mass or lymphadenopathy identified.  Mild cardiomegaly is noted.  IMPRESSION:  1.  No evidence of pulmonary embolism. 2.  Moderate bilateral pleural effusions and bibasilar atelectasis. 3.  Right upper lobe airspace disease, suspicious for pneumonia.   Original Report Authenticated By: Danae Orleans, M.D.     Scheduled Meds:   . aspirin EC  81 mg Oral Daily  . atorvastatin  40 mg Oral q1800  . carvedilol  3.125 mg Oral BID WC  . feeding supplement  237 mL Oral Q24H  . furosemide  40 mg Intravenous Once  . furosemide  40 mg Intravenous BID  . heparin  5,000 Units Subcutaneous Q8H  . irbesartan  150 mg Oral Daily  . moxifloxacin  400 mg Oral Daily  . sodium chloride  3 mL Intravenous Q12H   Continuous Infusions:   Principal Problem:  *Acute respiratory failure Active Problems:  HTN (hypertension)    Time spent: 30 minutes    [facility]  [facility]  If 8PM-8AM, please contact night-coverage at [email or web address], password [facility] 09/22/2012, 11:50 AM  LOS: 1 day

## 2012-09-22 NOTE — Progress Notes (Signed)
  Echocardiogram 2D Echocardiogram has been performed.  Andrea [name] 09/22/2012, 12:28 PM

## 2012-09-22 NOTE — Progress Notes (Signed)
Patient seen and examined by me.  Agree with plan.  Await echo and will change medications based on this.  Patient is in room ambulating currently.  Marlin Canary DO

## 2012-09-22 NOTE — Care Management Note (Unsigned)
    Page 1 of 1   09/22/2012     1:08:49 PM   CARE MANAGEMENT NOTE 09/22/2012  Patient:  Andrea [name], Andrea [name]   Account Number:  [id number]  Date Initiated:  09/22/2012  Documentation initiated by:  CRAFT,TERRI  Subjective/Objective Assessment:   78 yo female admitted 09/21/12 difficulty breathing.     Action/Plan:   D/C when medically stable   Anticipated DC Date:  09/25/2012   Anticipated DC Plan:  HOME W HOME HEALTH SERVICES      DC Planning Services  CM consult      Choice offered to / List presented to:  C-1 Patient        HH arranged  HH-1 RN      [facility] agency  [facility]   Status of service:  In process, will continue to follow  Per UR Regulation:  Reviewed for med. necessity/level of care/duration of care  Comments:  09/22/12, Kathi Der RNC-MNN, BSN, [phone], CM received referral.  CM met with pt and pt's son to offer choice for [facility] services.  Pt and pt's son chose [facility].  Lupita Leash at [facility] contacted with order and confirmation received.

## 2012-09-22 NOTE — Plan of Care (Signed)
Problem: Phase I Progression Outcomes Goal: Dyspnea controlled at rest Outcome: Completed/Met Date Met:  09/22/12 Pt has had no c/o SOB while at rest, pt on 2L of O2, goal met Goal: Tolerating diet Outcome: Progressing Pt on heart healthy diet, no c/o N/V, pt ate well for breakfast, ensures ordered once a day to help with appetite, goal improving

## 2012-09-23 LAB — BASIC METABOLIC PANEL WITH GFR
BUN: 26 mg/dL — ABNORMAL HIGH (ref 6–23)
BUN: 24 mg/dL — ABNORMAL HIGH (ref 6–23)
CO2: 29 meq/L (ref 19–32)
CO2: 29 meq/L (ref 19–32)
Calcium: 7.8 mg/dL — ABNORMAL LOW (ref 8.4–10.5)
Calcium: 8.2 mg/dL — ABNORMAL LOW (ref 8.4–10.5)
Chloride: 99 meq/L (ref 96–112)
Chloride: 100 meq/L (ref 96–112)
Creatinine, Ser: 0.85 mg/dL (ref 0.50–1.10)
Creatinine, Ser: 0.78 mg/dL (ref 0.50–1.10)
GFR calc Af Amer: 68 mL/min — ABNORMAL LOW (ref >90)
GFR calc Af Amer: 83 mL/min — ABNORMAL LOW (ref >90)
GFR calc non Af Amer: 59 mL/min — ABNORMAL LOW (ref >90)
GFR calc non Af Amer: 72 mL/min — ABNORMAL LOW (ref >90)
Glucose, Bld: 113 mg/dL — ABNORMAL HIGH (ref 70–99)
Glucose, Bld: 112 mg/dL — ABNORMAL HIGH (ref 70–99)
Potassium: 2.9 meq/L — ABNORMAL LOW (ref 3.5–5.1)
Potassium: 4.1 meq/L (ref 3.5–5.1)
Sodium: 136 meq/L (ref 135–145)
Sodium: 136 meq/L (ref 135–145)

## 2012-09-23 LAB — TROPONIN I: Troponin I: <0.3 ng/mL (ref <0.30)

## 2012-09-23 LAB — CBC
Hemoglobin: 11.3 g/dL — ABNORMAL LOW (ref 12.0–15.0)
MCH: 29.5 pg (ref 26.0–34.0)
MCHC: 33.4 g/dL (ref 30.0–36.0)
MCV: 88.3 fL (ref 78.0–100.0)
RBC: 3.83 MIL/uL — ABNORMAL LOW (ref 3.87–5.11)

## 2012-09-23 MED ORDER — POTASSIUM CHLORIDE 20 MEQ/15ML (10%) PO LIQD
40.0000 meq | Freq: Every day | ORAL | Status: AC
  Administered 2012-09-23 (×2): 40 meq via ORAL
  Filled 2012-09-23 (×2): qty 30

## 2012-09-23 MED ORDER — POTASSIUM CHLORIDE CRYS ER 20 MEQ PO TBCR
40.0000 meq | EXTENDED_RELEASE_TABLET | ORAL | Status: DC
  Administered 2012-09-23: 40 meq via ORAL
  Filled 2012-09-23: qty 2

## 2012-09-23 NOTE — Evaluation (Signed)
Physical Therapy Evaluation Patient Details Name: Andrea [name] MRN: [id number] DOB: 1920/03/15 Today's Date: 09/23/2012 Time: 0865-7846 PT Time Calculation (min): 16 min  PT Assessment / Plan / Recommendation Clinical Impression  Pt s/p respiratory failure with decr mobility secondary to decr endurance. Will benefit from HHPT f/u and use of RW at home.  Pt agrees.      PT Assessment  Patient needs continued PT services    Follow Up Recommendations  Home health PT;Supervision/Assistance - 24 hour    Does the patient have the potential to tolerate intense rehabilitation      Barriers to Discharge        Equipment Recommendations  Rolling walker with 5" wheels;Tub/shower seat    Recommendations for Other Services     Frequency Min 3X/week    Precautions / Restrictions Precautions Precautions: None Restrictions Weight Bearing Restrictions: No   Pertinent Vitals/Pain VSS, No pain      Mobility  Bed Mobility Bed Mobility: Supine to Sit Supine to Sit: 6: Modified independent (Device/Increase time) Sit to Supine: Not Tested (comment) Transfers Transfers: Sit to Stand;Stand to Sit Sit to Stand: 5: Supervision;With upper extremity assist;From bed Stand to Sit: 5: Supervision;With upper extremity assist;To chair/3-in-1;With armrests Ambulation/Gait Ambulation/Gait Assistance: 4: Min guard Ambulation Distance (Feet): 125 Feet Assistive device: Rolling walker Ambulation/Gait Assistance Details: cues needed to stay close to RW and for upright posture.  Occasional cues for sequencing.   Gait Pattern: Step-through pattern;Decreased stride length;Wide base of support;Trunk flexed Gait velocity: decreaased Stairs: No Wheelchair Mobility Wheelchair Mobility: No              PT Diagnosis: Generalized weakness  PT Problem List: Decreased activity tolerance;Decreased balance;Decreased mobility;Decreased knowledge of use of DME;Decreased safety awareness;Decreased knowledge  of precautions PT Treatment Interventions: DME instruction;Gait training;Stair training;Functional mobility training;Therapeutic activities;Therapeutic exercise;Balance training;Patient/family education   PT Goals Acute Rehab PT Goals PT Goal Formulation: With patient Time For Goal Achievement: 09/30/12 Potential to Achieve Goals: Good Pt will Ambulate: >150 feet;with modified independence;with least restrictive assistive device PT Goal: Ambulate - Progress: Goal set today Pt will Go Up / Down Stairs: 3-5 stairs;with modified independence;with least restrictive assistive device PT Goal: Up/Down Stairs - Progress: Goal set today Additional Goals Additional Goal #1: Pt to score >50/56 on Berg test. PT Goal: Additional Goal #1 - Progress: Goal set today  Visit Information  Last PT Received On: 09/23/12 Assistance Needed: +1    Subjective Data  Subjective: "I feel like I can walk." Patient Stated Goal: To go home.   Prior Functioning  Home Living Lives With: Alone Available Help at Discharge: Family;Friend(s);Available 24 hours/day Type of Home: House Home Access: Stairs to enter [employer] of Steps: 3 Entrance Stairs-Rails: Right;Left Home Layout: Two level;Full bath on main level Bathroom Shower/Tub: Walk-in shower;Door [employer] Toilet: Handicapped height Bathroom Accessibility: Yes How Accessible: Accessible via walker Home Adaptive Equipment: Straight cane Additional Comments: son assists as needed Prior Function Level of Independence: Independent;Independent with assistive device(s) (occasionally uses cane) Able to Take Stairs?: Yes Driving: No 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[employer] Dominant Hand: Right    Cognition  Overall Cognitive Status: Appears within functional limits for tasks assessed/performed Arousal/Alertness: Awake/alert Orientation Level: Appears intact for tasks assessed Behavior During Session: [facility] for tasks performed      Extremity/Trunk Assessment Right Lower Extremity Assessment RLE ROM/Strength/Tone: [facility] for tasks assessed Left Lower Extremity Assessment LLE ROM/Strength/Tone: [facility] for tasks assessed Trunk Assessment Trunk Assessment: Normal   Balance Static Standing Balance  Static Standing - Balance Support: Bilateral upper extremity supported;During functional activity Static Standing - Level of Assistance: 5: Stand by assistance Static Standing - Comment/# of Minutes: 1 minute with RW  End of Session PT - End of Session Equipment Utilized During Treatment: Gait belt Activity Tolerance: Patient tolerated treatment well Patient left: in chair;with call bell/phone within reach Nurse Communication: Mobility status       INGOLD,Morgyn Marut 09/23/2012, 1:48 PM  [facility] Acute Rehabilitation [phone] (pager)

## 2012-09-23 NOTE — Progress Notes (Signed)
[facility] PROGRESS NOTE  Andrea [name] ZOX:[id number] DOB: 1920-11-06 DOA: 09/21/2012 PCP: Elby Showers, MD  Assessment/Plan Acute respiratory failure:  -78 year-old with only past medical history of hypertension etiology unclear. BNP is elevated, cardiac enzymes negative.  Chest x-ray showed bilateral pleural effusions. CT chest neg for PE Concern for CHF. -She was recently treated with Avelox, she does have an infiltrate on chest x-ray, though she did have a pneumonia at that time he would take up to 6 weeks to clear on chest x-ray. She has no increase in her white blood cell count, no fever and minimal cough.  Will continue lasix and low sod diet. Echo shows dialstolic dysfunction.  Giving low dose lasix and K dur- will ned close monitoring  HTN (hypertension):  -Blood pressure seems to be well controlled, we'll continue lisinopril. start low dose beta blocker today.   hypokalmeia-  Repleat, check mg  Code Status:  Family Communication: pt at bedside Disposition Plan: home when stable   Consultants:  none  Procedures:  none  Antibiotics:  Avelox  HPI/Subjective: Ambulating in room. Gait slow but steady. Denies pain/discomfort. Wants to go home  Objective: Filed Vitals:   09/22/12 0452 09/22/12 1408 09/22/12 1935 09/23/12 0500  BP: 124/69 95/60 109/64 114/63  Pulse: 88 89 83 81  Temp: 98.1 F (36.7 C) 97.2 F (36.2 C) 97.9 F (36.6 C) 97.7 F (36.5 C)  TempSrc: Oral Oral Oral Oral  Resp: 20 20 18 18   Height:      Weight: 51.393 kg (113 lb 4.8 oz)   52.9 kg (116 lb 10 oz)  SpO2: 98% 98% 95% 97%    Intake/Output Summary (Last 24 hours) at 09/23/12 1229 Last data filed at 09/23/12 1100  Gross per 24 hour  Intake   1040 ml  Output   1351 ml  Net   -311 ml   Filed Weights   09/21/12 1715 09/22/12 0452 09/23/12 0500  Weight: 52.663 kg (116 lb 1.6 oz) 51.393 kg (113 lb 4.8 oz) 52.9 kg (116 lb 10 oz)    Exam:   General:  Somewhat frail  appearing. NAD  Cardiovascular: RRR. +murmur. Trace LEE PPP  Respiratory: normal effort. BS distant. Fine crackles bases  Abdomen: soft non-tender. +BS  Data Reviewed: Basic Metabolic Panel:  Lab 09/23/12 8119 09/22/12 0520 09/21/12 1912 09/21/12 1324  NA 136 139 -- 133*  K 2.9* 3.3* -- 4.0  CL 99 100 -- 101  CO2 29 26 -- 21  GLUCOSE 113* 102* -- 128*  BUN 26* 18 -- 19  CREATININE 0.85 0.79 0.74 0.71  CALCIUM 7.8* 8.3* -- 8.7  MG -- -- -- --  PHOS -- -- -- --   Liver Function Tests: No results found for this basename: AST:5,ALT:5,ALKPHOS:5,BILITOT:5,PROT:5,ALBUMIN:5 in the last 168 hours No results found for this basename: LIPASE:5,AMYLASE:5 in the last 168 hours No results found for this basename: AMMONIA:5 in the last 168 hours CBC:  Lab 09/23/12 0520 09/21/12 1912 09/21/12 1324  WBC 9.0 8.2 8.5  NEUTROABS -- -- --  HGB 11.3* 11.5* 11.8*  HCT 33.8* 33.2* 35.1*  MCV 88.3 87.8 88.0  PLT 254 264 266   Cardiac Enzymes:  Lab 09/22/12 2321 09/22/12 1823 09/22/12 1239  CKTOTAL -- -- --  CKMB -- -- --  CKMBINDEX -- -- --  TROPONINI <0.30 <0.30 <0.30   BNP (last 3 results)  Basename 09/21/12 1323  PROBNP 1973.0*   CBG: No results found for this basename: GLUCAP:5 in the last  168 hours  Recent Results (from the past 240 hour(s))  URINE CULTURE     Status: Normal   Collection Time   09/21/12  3:16 PM      Component Value Range Status Comment   Specimen Description URINE, CATHETERIZED   Final    Special Requests NONE   Final    Culture  Setup Time 09/21/2012 15:55   Final    Colony Count NO GROWTH   Final    Culture NO GROWTH   Final    Report Status 09/22/2012 FINAL   Final      Studies: Dg Chest 2 View  09/21/2012  *RADIOLOGY REPORT*  Clinical Data: Cough, pneumonia.  CHEST - 2 VIEW  Comparison: None.  Findings: Mild cardiomegaly is noted.  Moderate bilateral pleural effusions are noted with possible underlying pneumonia or atelectasis.  Faint right upper  lobe opacity is noted which may represent pneumonia.  IMPRESSION: Moderate bilateral pleural effusions with possible underlying pneumonia atelectasis.  Faint right upper lobe opacity which may represent pneumonia; follow-up radiographs are recommended to ensure resolution and rule out possible underlying neoplasm or malignancy.   Original Report Authenticated By: Venita Sheffield., M.D.    Ct Angio Chest Pe W/cm &/or Wo Cm  09/21/2012  *RADIOLOGY REPORT*  Clinical Data: Shortness of breath.  Elevated D-dimer.  CT ANGIOGRAPHY CHEST  Technique:  Multidetector CT imaging of the chest using the standard protocol during bolus administration of intravenous contrast. Multiplanar reconstructed images including MIPs were obtained and reviewed to evaluate the vascular anatomy.  Contrast: 80mL OMNIPAQUE IOHEXOL 350 MG/ML SOLN  Comparison: None.  Findings: Satisfactory opacification of the pulmonary arteries noted, and there is no evidence of pulmonary emboli.  The no evidence of thoracic aortic aneurysm.  No evidence of mediastinal mass.  No lymphadenopathy seen within the thorax.  Moderate bilateral pleural effusions are seen with compressive atelectasis in the lung bases.  There is ill-defined airspace opacity in the right upper lobe, suspicious for pneumonia.  No central mass or lymphadenopathy identified.  Mild cardiomegaly is noted.  IMPRESSION:  1.  No evidence of pulmonary embolism. 2.  Moderate bilateral pleural effusions and bibasilar atelectasis. 3.  Right upper lobe airspace disease, suspicious for pneumonia.   Original Report Authenticated By: Danae Orleans, M.D.     Scheduled Meds:    . aspirin EC  81 mg Oral Daily  . atorvastatin  40 mg Oral q1800  . carvedilol  3.125 mg Oral BID WC  . feeding supplement  237 mL Oral Q24H  . furosemide  20 mg Oral Daily  . heparin  5,000 Units Subcutaneous Q8H  . irbesartan  150 mg Oral Daily  . potassium chloride  40 mEq Oral 6 X Daily  . sodium chloride  3 mL  Intravenous Q12H  . DISCONTD: furosemide  40 mg Intravenous BID  . DISCONTD: potassium chloride  40 mEq Oral Q4H   Continuous Infusions:   Principal Problem:  *Acute respiratory failure Active Problems:  HTN (hypertension)  Hypokalemia  Edema    Time spent: 30 minutes    Andrea [name]  [facility]  If 8PM-8AM, please contact night-coverage at [email or web address], password [facility] 09/23/2012, 12:29 PM  LOS: 2 days

## 2012-09-24 LAB — CBC
HCT: 36.4 % (ref 36.0–46.0)
Hemoglobin: 11.9 g/dL — ABNORMAL LOW (ref 12.0–15.0)
MCH: 29.4 pg (ref 26.0–34.0)
MCHC: 32.7 g/dL (ref 30.0–36.0)
MCV: 89.9 fL (ref 78.0–100.0)
Platelets: 266 K/uL (ref 150–400)
RBC: 4.05 MIL/uL (ref 3.87–5.11)
RDW: 14.3 % (ref 11.5–15.5)
WBC: 9.9 K/uL (ref 4.0–10.5)

## 2012-09-24 LAB — BASIC METABOLIC PANEL
BUN: 22 mg/dL (ref 6–23)
Calcium: 8.1 mg/dL — ABNORMAL LOW (ref 8.4–10.5)
Creatinine, Ser: 0.84 mg/dL (ref 0.50–1.10)
GFR calc Af Amer: 69 mL/min — ABNORMAL LOW (ref >90)
GFR calc non Af Amer: 60 mL/min — ABNORMAL LOW (ref >90)
Glucose, Bld: 111 mg/dL — ABNORMAL HIGH (ref 70–99)

## 2012-09-24 MED ORDER — BENZONATATE 100 MG PO CAPS: 100.0000 mg | ORAL_CAPSULE | Freq: Three times a day (TID) | ORAL | Status: DC | PRN

## 2012-09-24 MED ORDER — FUROSEMIDE 20 MG PO TABS: 20.0000 mg | ORAL_TABLET | Freq: Every day | ORAL | Status: DC

## 2012-09-24 MED ORDER — ASPIRIN 81 MG PO TBEC: 81.0000 mg | DELAYED_RELEASE_TABLET | Freq: Every day | ORAL | Status: DC

## 2012-09-24 MED ORDER — POTASSIUM CHLORIDE ER 10 MEQ PO TBCR: 20.0000 meq | EXTENDED_RELEASE_TABLET | Freq: Every day | ORAL | Status: DC

## 2012-09-24 NOTE — Discharge Summary (Addendum)
Physician Discharge Summary  Andrea [name] ZOX:[id number] DOB: [birth year] DOA: 09/21/2012  PCP: Elby Showers, MD  Admit date: 09/21/2012 Discharge date: 09/24/2012  Recommendations for Outpatient Follow-up:  1. Home health- RN, PT/OT 2. Keep cardiology appointment in 1 week at [facility]  Discharge Diagnoses:  Principal Problem:  *Acute respiratory failure Active Problems:  HTN (hypertension)  Hypokalemia  Edema   Discharge Condition: improved  Diet recommendation: cardiac with fluid restriction  Filed Weights   09/22/12 0452 09/23/12 0500 09/24/12 0520  Weight: 51.393 kg (113 lb 4.8 oz) Andrea.9 kg (116 lb 10 oz) Andrea.39 kg (115 lb 8 oz)    History of present illness:  Andrea [name] is a 78 y.o. [name]  There is an 78 year old [name] with past medical history of hypertension that comes in for dyspnea that started 5 days prior to admission. She relates she wants to an urgent care chest x-ray was done and was.nose with pneumonia she was started on azithromycin empirically. Did not improve went to see her primary care Dr. Verl Bangs her on Avelox she progressively got worse to the point where she couldn't walk. So she decided to come to the emergency room. She also been complaining of swelling of the feet since Saturday. She denies any fever, or weight loss, no chest pain nausea vomiting or diarrhea. No recent viral. She relates no night sweats infections.   Hospital Course:  Acute respiratory failure:   BNP is elevated, cardiac enzymes negative. Chest x-ray showed bilateral pleural effusions. CT chest neg for PE Concern for CHF. -She was recently treated with Avelox, she does have an infiltrate on chest x-ray, though she did have a pneumonia at that time he would take up to 6 weeks to clear on chest x-ray. She has no increase in her white blood cell count, no fever and minimal cough. Will continue lasix and low sod diet. Echo shows dialstolic dysfunction and severe mitral regurg- has  appointment with cardiology in 1 week.   Giving low dose lasix and K dur- will need close monitoring   HTN (hypertension):  -Blood pressure seems to be well controlled, we'll continue ARB. BP was not high enough for low dose BB.   hypokalmeia-  Repleat  Severe mitral regurg- spoke with son who did not think surgery would be app.  I agree.  Will try to control BP and fluid.  Lasix given daily, will need close monitoring by PCP/cardiology   Procedures: Study Conclusions  - Left ventricle: The cavity size was normal. Wall thickness was normal. Systolic function was normal. The estimated ejection fraction was in the range of 55% to 60%. Wall motion was normal; there were no regional wall motion abnormalities. Features are consistent with a pseudonormal left ventricular filling pattern, with concomitant abnormal relaxation and increased filling pressure (grade 2 diastolic dysfunction). - Mitral valve: Calcified annulus. Mildly thickened leaflets . Prolapse, involving the posterior leaflet. Severe regurgitation. - Left atrium: The atrium was mildly dilated. - Tricuspid valve: Moderate regurgitation. - Pulmonary arteries: Systolic pressure was mildly to moderately increased. - Pericardium, extracardiac: There was a left pleural effusion. Impressions:  - Partially flail posterior MV leaflet with severe MR; mobile density associated with MV (probable ruptured chordae; cannot exclude vegetation);    Consultations:  none  Discharge Exam: Filed Vitals:   09/23/12 1904 09/23/12 1959 09/24/12 0520 09/24/12 0816  BP: 106/70 100/64 120/67   Pulse: 86 88 88   Temp:  97.9 F (36.6 C) 98 F (36.7 C)   TempSrc:  Oral  Oral   Resp:  21 18   Height:      Weight:   Andrea.39 kg (115 lb 8 oz)   SpO2:  100% 98% 90%    General: A+Ox3, NAD Cardiovascular: rrr, + murmur Respiratory: clear, no wheezing  Discharge Instructions      Discharge Orders    Future Orders Please Complete By  Expires   Diet - low sodium heart healthy      Increase activity slowly      Heart Failure patients record your daily weight using the same scale at the same time of day      (HEART FAILURE PATIENTS) Call MD:  Anytime you have any of the following symptoms: 1) 3 pound weight gain in 24 hours or 5 pounds in 1 week 2) shortness of breath, with or without a dry hacking cough 3) swelling in the hands, feet or stomach 4) if you have to sleep on extra pillows at night in order to breathe.      Discharge instructions      Comments:   Fluid restrict 1.5L/day Home health PT/OT/RN   Discharge instructions      Comments:   BMP Wed for K+ and Cr   ACE Inhibitor / ARB already ordered          Medication List     As of 09/24/2012 10:47 AM    STOP taking these medications         guaiFENesin 600 MG 12 hr tablet   Commonly known as: MUCINEX      naproxen sodium 220 MG tablet   Commonly known as: ANAPROX      TAKE these medications         aspirin 81 MG EC tablet   Take 1 tablet (81 mg total) by mouth daily.      benzonatate 100 MG capsule   Commonly known as: TESSALON   Take 1 capsule (100 mg total) by mouth 3 (three) times daily as needed for cough.      CALCIUM 1200 PO   Take 1,200 mg by mouth daily.      cholecalciferol 1000 UNITS tablet   Commonly known as: VITAMIN D   Take 1,000 Units by mouth daily.      furosemide 20 MG tablet   Commonly known as: LASIX   Take 1 tablet (20 mg total) by mouth daily.      moxifloxacin 400 MG tablet   Commonly known as: AVELOX   Take 400 mg by mouth daily. For 10 days starting on 09/17/12      multivitamin with minerals Tabs   Take 1 tablet by mouth daily.      potassium chloride 10 MEQ tablet   Commonly known as: K-DUR   Take 2 tablets (20 mEq total) by mouth daily.      telmisartan 80 MG tablet   Commonly known as: MICARDIS   Take 80 mg by mouth daily.            The results of significant diagnostics from this  hospitalization (including imaging, microbiology, ancillary and laboratory) are listed below for reference.    Significant Diagnostic Studies: Dg Chest 2 View  09/21/2012  *RADIOLOGY REPORT*  Clinical Data: Cough, pneumonia.  CHEST - 2 VIEW  Comparison: None.  Findings: Mild cardiomegaly is noted.  Moderate bilateral pleural effusions are noted with possible underlying pneumonia or atelectasis.  Faint right upper lobe opacity is noted which may represent pneumonia.  IMPRESSION: Moderate  bilateral pleural effusions with possible underlying pneumonia atelectasis.  Faint right upper lobe opacity which may represent pneumonia; follow-up radiographs are recommended to ensure resolution and rule out possible underlying neoplasm or malignancy.   Original Report Authenticated By: Venita Sheffield., M.D.    Ct Angio Chest Pe W/cm &/or Wo Cm  09/21/2012  *RADIOLOGY REPORT*  Clinical Data: Shortness of breath.  Elevated D-dimer.  CT ANGIOGRAPHY CHEST  Technique:  Multidetector CT imaging of the chest using the standard protocol during bolus administration of intravenous contrast. Multiplanar reconstructed images including MIPs were obtained and reviewed to evaluate the vascular anatomy.  Contrast: 80mL OMNIPAQUE IOHEXOL 350 MG/ML SOLN  Comparison: None.  Findings: Satisfactory opacification of the pulmonary arteries noted, and there is no evidence of pulmonary emboli.  The no evidence of thoracic aortic aneurysm.  No evidence of mediastinal mass.  No lymphadenopathy seen within the thorax.  Moderate bilateral pleural effusions are seen with compressive atelectasis in the lung bases.  There is ill-defined airspace opacity in the right upper lobe, suspicious for pneumonia.  No central mass or lymphadenopathy identified.  Mild cardiomegaly is noted.  IMPRESSION:  1.  No evidence of pulmonary embolism. 2.  Moderate bilateral pleural effusions and bibasilar atelectasis. 3.  Right upper lobe airspace disease, suspicious  for pneumonia.   Original Report Authenticated By: Danae Orleans, M.D.     Microbiology: Recent Results (from the past 240 hour(s))  URINE CULTURE     Status: Normal   Collection Time   09/21/12  3:16 PM      Component Value Range Status Comment   Specimen Description URINE, CATHETERIZED   Final    Special Requests NONE   Final    Culture  Setup Time 09/21/2012 15:55   Final    Colony Count NO GROWTH   Final    Culture NO GROWTH   Final    Report Status 09/22/2012 FINAL   Final      Labs: Basic Metabolic Panel:  Lab 09/24/12 2956 09/23/12 1608 09/23/12 0520 09/22/12 0520 09/21/12 1912 09/21/12 1324  NA 137 136 136 139 -- 133*  K 4.7 4.1 2.9* 3.3* -- 4.0  CL 105 100 99 100 -- 101  CO2 25 29 29 26  -- 21  GLUCOSE 111* 112* 113* 102* -- 128*  BUN 22 24* 26* 18 -- 19  CREATININE 0.84 0.78 0.85 0.79 0.74 --  CALCIUM 8.1* 8.2* 7.8* 8.3* -- 8.7  MG -- -- -- -- -- --  PHOS -- -- -- -- -- --   Liver Function Tests: No results found for this basename: AST:5,ALT:5,ALKPHOS:5,BILITOT:5,PROT:5,ALBUMIN:5 in the last 168 hours No results found for this basename: LIPASE:5,AMYLASE:5 in the last 168 hours No results found for this basename: AMMONIA:5 in the last 168 hours CBC:  Lab 09/24/12 0615 09/23/12 0520 09/21/12 1912 09/21/12 1324  WBC 9.9 9.0 8.2 8.5  NEUTROABS -- -- -- --  HGB 11.9* 11.3* 11.5* 11.8*  HCT 36.4 33.8* 33.2* 35.1*  MCV 89.9 88.3 87.8 88.0  PLT 266 254 264 266   Cardiac Enzymes:  Lab 09/22/12 2321 09/22/12 1823 09/22/12 1239  CKTOTAL -- -- --  CKMB -- -- --  CKMBINDEX -- -- --  TROPONINI <0.30 <0.30 <0.30   BNP: BNP (last 3 results)  Basename 09/21/12 1323  PROBNP 1973.0*   CBG: No results found for this basename: GLUCAP:5 in the last 168 hours  Time coordinating discharge: 35 minutes  Signed:  Benjamine Mola, Zeta Bucy  [facility] 09/24/2012, 10:47 AM

## 2012-09-24 NOTE — Progress Notes (Signed)
[facility] scheduled to deliver RW to pt's room this evening prior to d/c. Isidoro Donning RN CCM Case Mgmt phone [phone]

## 2012-09-24 NOTE — Progress Notes (Signed)
Walked in hallway x 2 and client's oxygen saturation is between 94-97% per oximetry.  She c/o of not being able to breathe, but, not in distress.  Becomes anxious when walking.  When sitting on side of bed without oxygen she does not appear to be short of breath.  Thanks, [employer]

## 2012-09-24 NOTE — Progress Notes (Signed)
NCM notified [facility] of pt's scheduled d/c home today with HH PT, OT and RN. [facility] contact info added to d/c instructions. Isidoro Donning RN CCM Case Mgmt phone [phone]

## 2012-09-26 DIAGNOSIS — I509 Heart failure, unspecified: Secondary | ICD-10-CM | POA: Diagnosis not present

## 2012-09-26 DIAGNOSIS — J189 Pneumonia, unspecified organism: Secondary | ICD-10-CM | POA: Diagnosis not present

## 2012-09-26 DIAGNOSIS — I10 Essential (primary) hypertension: Secondary | ICD-10-CM | POA: Diagnosis not present

## 2012-09-26 DIAGNOSIS — M79609 Pain in unspecified limb: Secondary | ICD-10-CM | POA: Diagnosis not present

## 2012-09-27 DIAGNOSIS — I509 Heart failure, unspecified: Secondary | ICD-10-CM | POA: Diagnosis not present

## 2012-09-27 DIAGNOSIS — Z792 Long term (current) use of antibiotics: Secondary | ICD-10-CM | POA: Diagnosis not present

## 2012-09-27 DIAGNOSIS — I10 Essential (primary) hypertension: Secondary | ICD-10-CM | POA: Diagnosis not present

## 2012-09-27 DIAGNOSIS — J189 Pneumonia, unspecified organism: Secondary | ICD-10-CM | POA: Diagnosis not present

## 2012-09-27 DIAGNOSIS — M79609 Pain in unspecified limb: Secondary | ICD-10-CM | POA: Diagnosis not present

## 2012-09-29 DIAGNOSIS — J189 Pneumonia, unspecified organism: Secondary | ICD-10-CM | POA: Diagnosis not present

## 2012-09-29 DIAGNOSIS — I509 Heart failure, unspecified: Secondary | ICD-10-CM | POA: Diagnosis not present

## 2012-09-29 DIAGNOSIS — D649 Anemia, unspecified: Secondary | ICD-10-CM | POA: Diagnosis not present

## 2012-09-29 DIAGNOSIS — M79609 Pain in unspecified limb: Secondary | ICD-10-CM | POA: Diagnosis not present

## 2012-09-29 DIAGNOSIS — I10 Essential (primary) hypertension: Secondary | ICD-10-CM | POA: Diagnosis not present

## 2012-09-29 DIAGNOSIS — R0602 Shortness of breath: Secondary | ICD-10-CM | POA: Diagnosis not present

## 2012-10-03 DIAGNOSIS — M79609 Pain in unspecified limb: Secondary | ICD-10-CM | POA: Diagnosis not present

## 2012-10-03 DIAGNOSIS — I10 Essential (primary) hypertension: Secondary | ICD-10-CM | POA: Diagnosis not present

## 2012-10-03 DIAGNOSIS — I509 Heart failure, unspecified: Secondary | ICD-10-CM | POA: Diagnosis not present

## 2012-10-03 DIAGNOSIS — J189 Pneumonia, unspecified organism: Secondary | ICD-10-CM | POA: Diagnosis not present

## 2012-10-04 DIAGNOSIS — I059 Rheumatic mitral valve disease, unspecified: Secondary | ICD-10-CM | POA: Diagnosis not present

## 2012-10-04 DIAGNOSIS — I5031 Acute diastolic (congestive) heart failure: Secondary | ICD-10-CM | POA: Diagnosis not present

## 2012-10-06 DIAGNOSIS — I059 Rheumatic mitral valve disease, unspecified: Secondary | ICD-10-CM | POA: Diagnosis not present

## 2012-10-06 DIAGNOSIS — I5031 Acute diastolic (congestive) heart failure: Secondary | ICD-10-CM | POA: Diagnosis not present

## 2012-10-10 DIAGNOSIS — I10 Essential (primary) hypertension: Secondary | ICD-10-CM | POA: Diagnosis not present

## 2012-10-10 DIAGNOSIS — I509 Heart failure, unspecified: Secondary | ICD-10-CM | POA: Diagnosis not present

## 2012-10-10 DIAGNOSIS — J189 Pneumonia, unspecified organism: Secondary | ICD-10-CM | POA: Diagnosis not present

## 2012-10-10 DIAGNOSIS — M79609 Pain in unspecified limb: Secondary | ICD-10-CM | POA: Diagnosis not present

## 2012-10-11 DIAGNOSIS — M79609 Pain in unspecified limb: Secondary | ICD-10-CM | POA: Diagnosis not present

## 2012-10-11 DIAGNOSIS — J189 Pneumonia, unspecified organism: Secondary | ICD-10-CM | POA: Diagnosis not present

## 2012-10-11 DIAGNOSIS — I10 Essential (primary) hypertension: Secondary | ICD-10-CM | POA: Diagnosis not present

## 2012-10-11 DIAGNOSIS — I509 Heart failure, unspecified: Secondary | ICD-10-CM | POA: Diagnosis not present

## 2012-10-12 DIAGNOSIS — J189 Pneumonia, unspecified organism: Secondary | ICD-10-CM | POA: Diagnosis not present

## 2012-10-12 DIAGNOSIS — M79609 Pain in unspecified limb: Secondary | ICD-10-CM | POA: Diagnosis not present

## 2012-10-12 DIAGNOSIS — I509 Heart failure, unspecified: Secondary | ICD-10-CM | POA: Diagnosis not present

## 2012-10-12 DIAGNOSIS — I10 Essential (primary) hypertension: Secondary | ICD-10-CM | POA: Diagnosis not present

## 2012-10-18 DIAGNOSIS — I059 Rheumatic mitral valve disease, unspecified: Secondary | ICD-10-CM | POA: Diagnosis not present

## 2012-10-18 DIAGNOSIS — I5031 Acute diastolic (congestive) heart failure: Secondary | ICD-10-CM | POA: Diagnosis not present

## 2012-10-25 DIAGNOSIS — I10 Essential (primary) hypertension: Secondary | ICD-10-CM | POA: Diagnosis not present

## 2012-10-25 DIAGNOSIS — M79609 Pain in unspecified limb: Secondary | ICD-10-CM | POA: Diagnosis not present

## 2012-10-25 DIAGNOSIS — I509 Heart failure, unspecified: Secondary | ICD-10-CM | POA: Diagnosis not present

## 2012-10-25 DIAGNOSIS — J189 Pneumonia, unspecified organism: Secondary | ICD-10-CM | POA: Diagnosis not present

## 2012-11-23 DIAGNOSIS — I5032 Chronic diastolic (congestive) heart failure: Secondary | ICD-10-CM | POA: Diagnosis not present

## 2012-11-23 DIAGNOSIS — I059 Rheumatic mitral valve disease, unspecified: Secondary | ICD-10-CM | POA: Diagnosis not present

## 2012-12-27 DIAGNOSIS — I10 Essential (primary) hypertension: Secondary | ICD-10-CM | POA: Diagnosis not present

## 2012-12-27 DIAGNOSIS — M810 Age-related osteoporosis without current pathological fracture: Secondary | ICD-10-CM | POA: Diagnosis not present

## 2012-12-27 DIAGNOSIS — I5032 Chronic diastolic (congestive) heart failure: Secondary | ICD-10-CM | POA: Diagnosis not present

## 2012-12-27 DIAGNOSIS — H60399 Other infective otitis externa, unspecified ear: Secondary | ICD-10-CM | POA: Diagnosis not present

## 2013-02-21 DIAGNOSIS — I059 Rheumatic mitral valve disease, unspecified: Secondary | ICD-10-CM | POA: Diagnosis not present

## 2013-02-21 DIAGNOSIS — I5032 Chronic diastolic (congestive) heart failure: Secondary | ICD-10-CM | POA: Diagnosis not present

## 2013-02-21 DIAGNOSIS — I10 Essential (primary) hypertension: Secondary | ICD-10-CM | POA: Diagnosis not present

## 2013-06-22 DIAGNOSIS — I5032 Chronic diastolic (congestive) heart failure: Secondary | ICD-10-CM | POA: Diagnosis not present

## 2013-06-22 DIAGNOSIS — I059 Rheumatic mitral valve disease, unspecified: Secondary | ICD-10-CM | POA: Diagnosis not present

## 2013-06-22 DIAGNOSIS — I10 Essential (primary) hypertension: Secondary | ICD-10-CM | POA: Diagnosis not present

## 2013-06-27 DIAGNOSIS — M810 Age-related osteoporosis without current pathological fracture: Secondary | ICD-10-CM | POA: Diagnosis not present

## 2013-06-27 DIAGNOSIS — I10 Essential (primary) hypertension: Secondary | ICD-10-CM | POA: Diagnosis not present

## 2013-07-11 DIAGNOSIS — M6281 Muscle weakness (generalized): Secondary | ICD-10-CM | POA: Diagnosis not present

## 2013-08-03 DIAGNOSIS — M6281 Muscle weakness (generalized): Secondary | ICD-10-CM | POA: Diagnosis not present

## 2013-08-16 DIAGNOSIS — M6281 Muscle weakness (generalized): Secondary | ICD-10-CM | POA: Diagnosis not present

## 2013-08-30 DIAGNOSIS — Z23 Encounter for immunization: Secondary | ICD-10-CM | POA: Diagnosis not present

## 2013-09-24 DIAGNOSIS — M6281 Muscle weakness (generalized): Secondary | ICD-10-CM | POA: Diagnosis not present

## 2013-10-08 ENCOUNTER — Encounter: Payer: Self-pay | Admitting: Cardiology

## 2013-10-08 DIAGNOSIS — M6281 Muscle weakness (generalized): Secondary | ICD-10-CM | POA: Diagnosis not present

## 2013-10-08 IMAGING — CR DG CHEST 2V
2 series · 2 of 2 positions shown · non-contrast
Comparison: None.

CLINICAL DATA: Cough, pneumonia.

CHEST - 2 VIEW

[w chest pa]
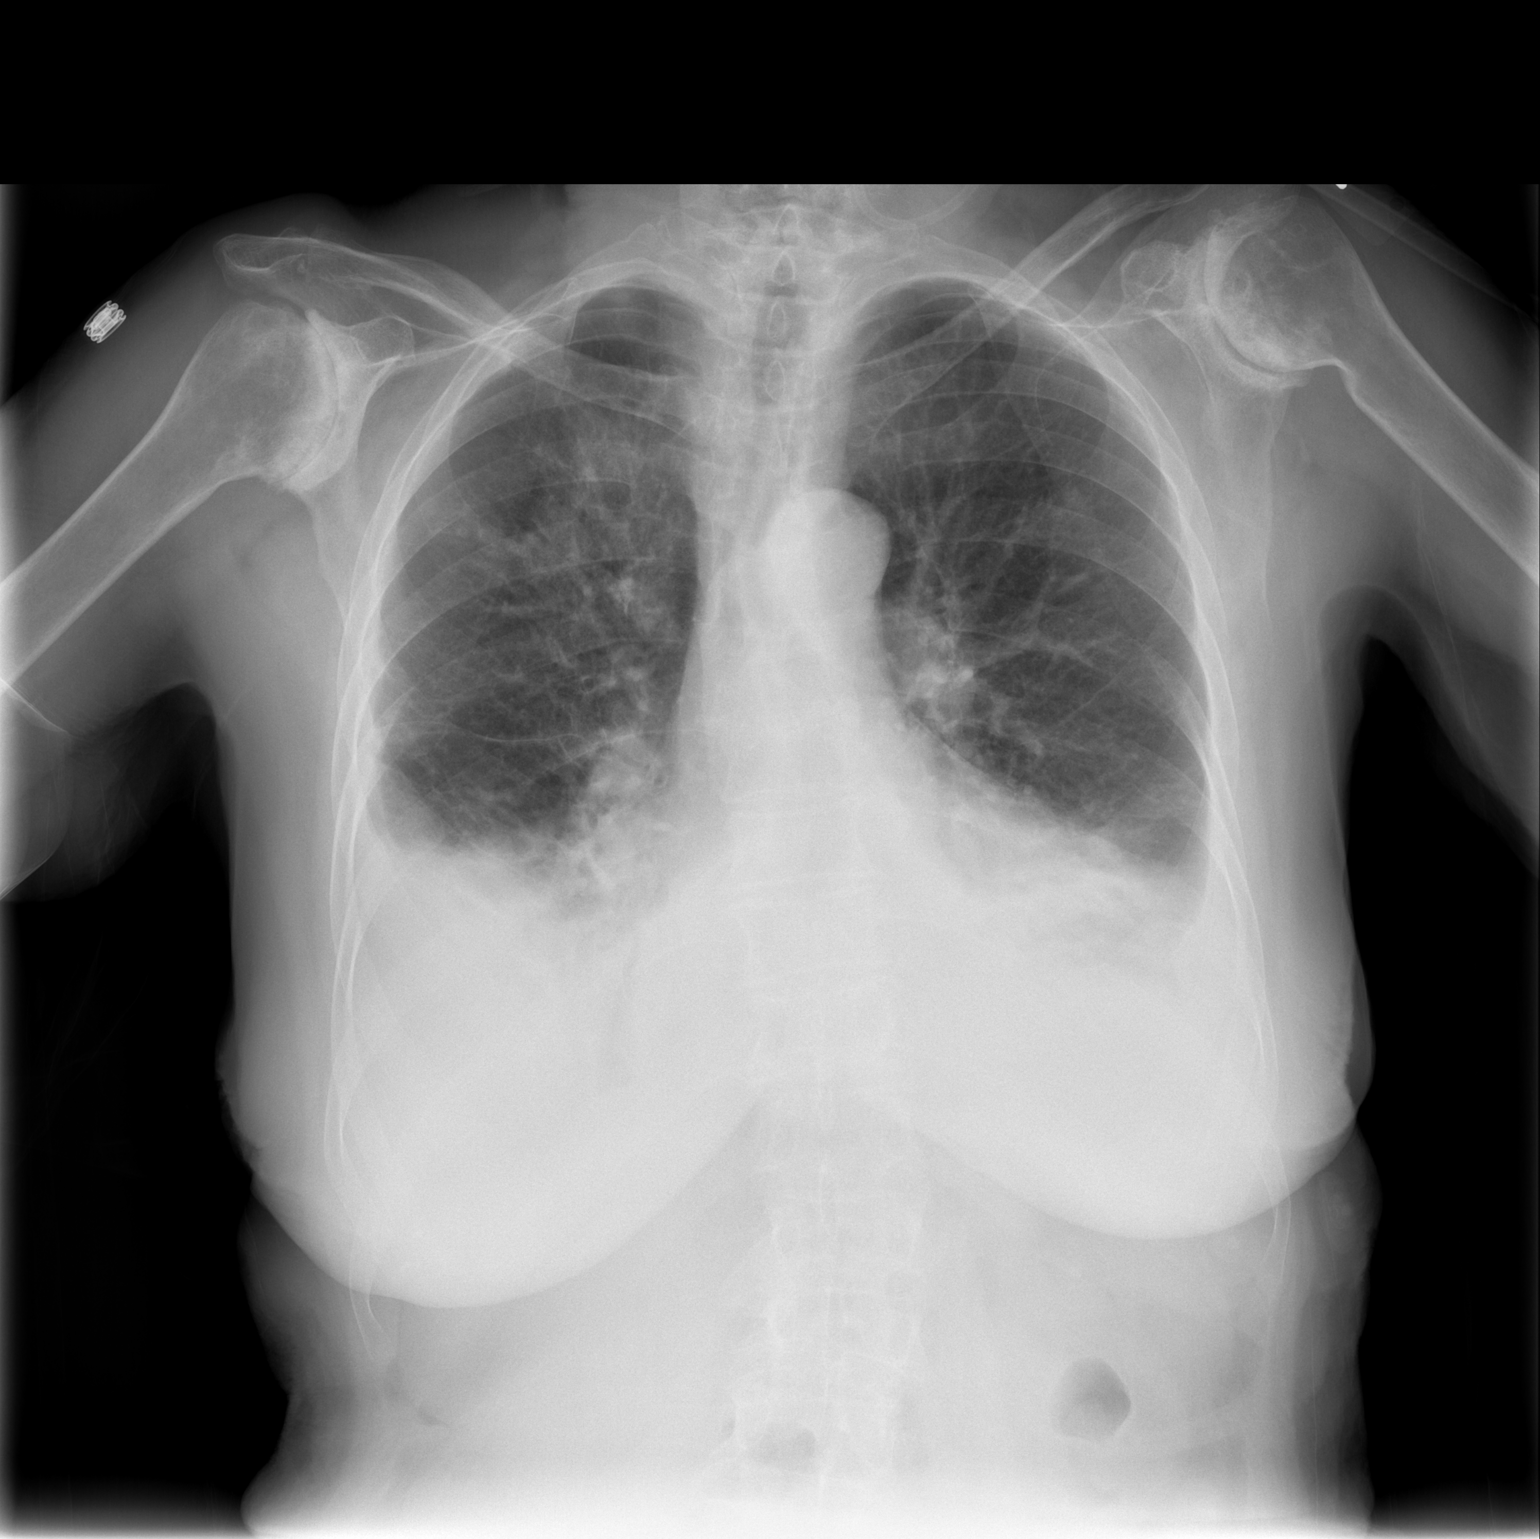

[w chest lat]
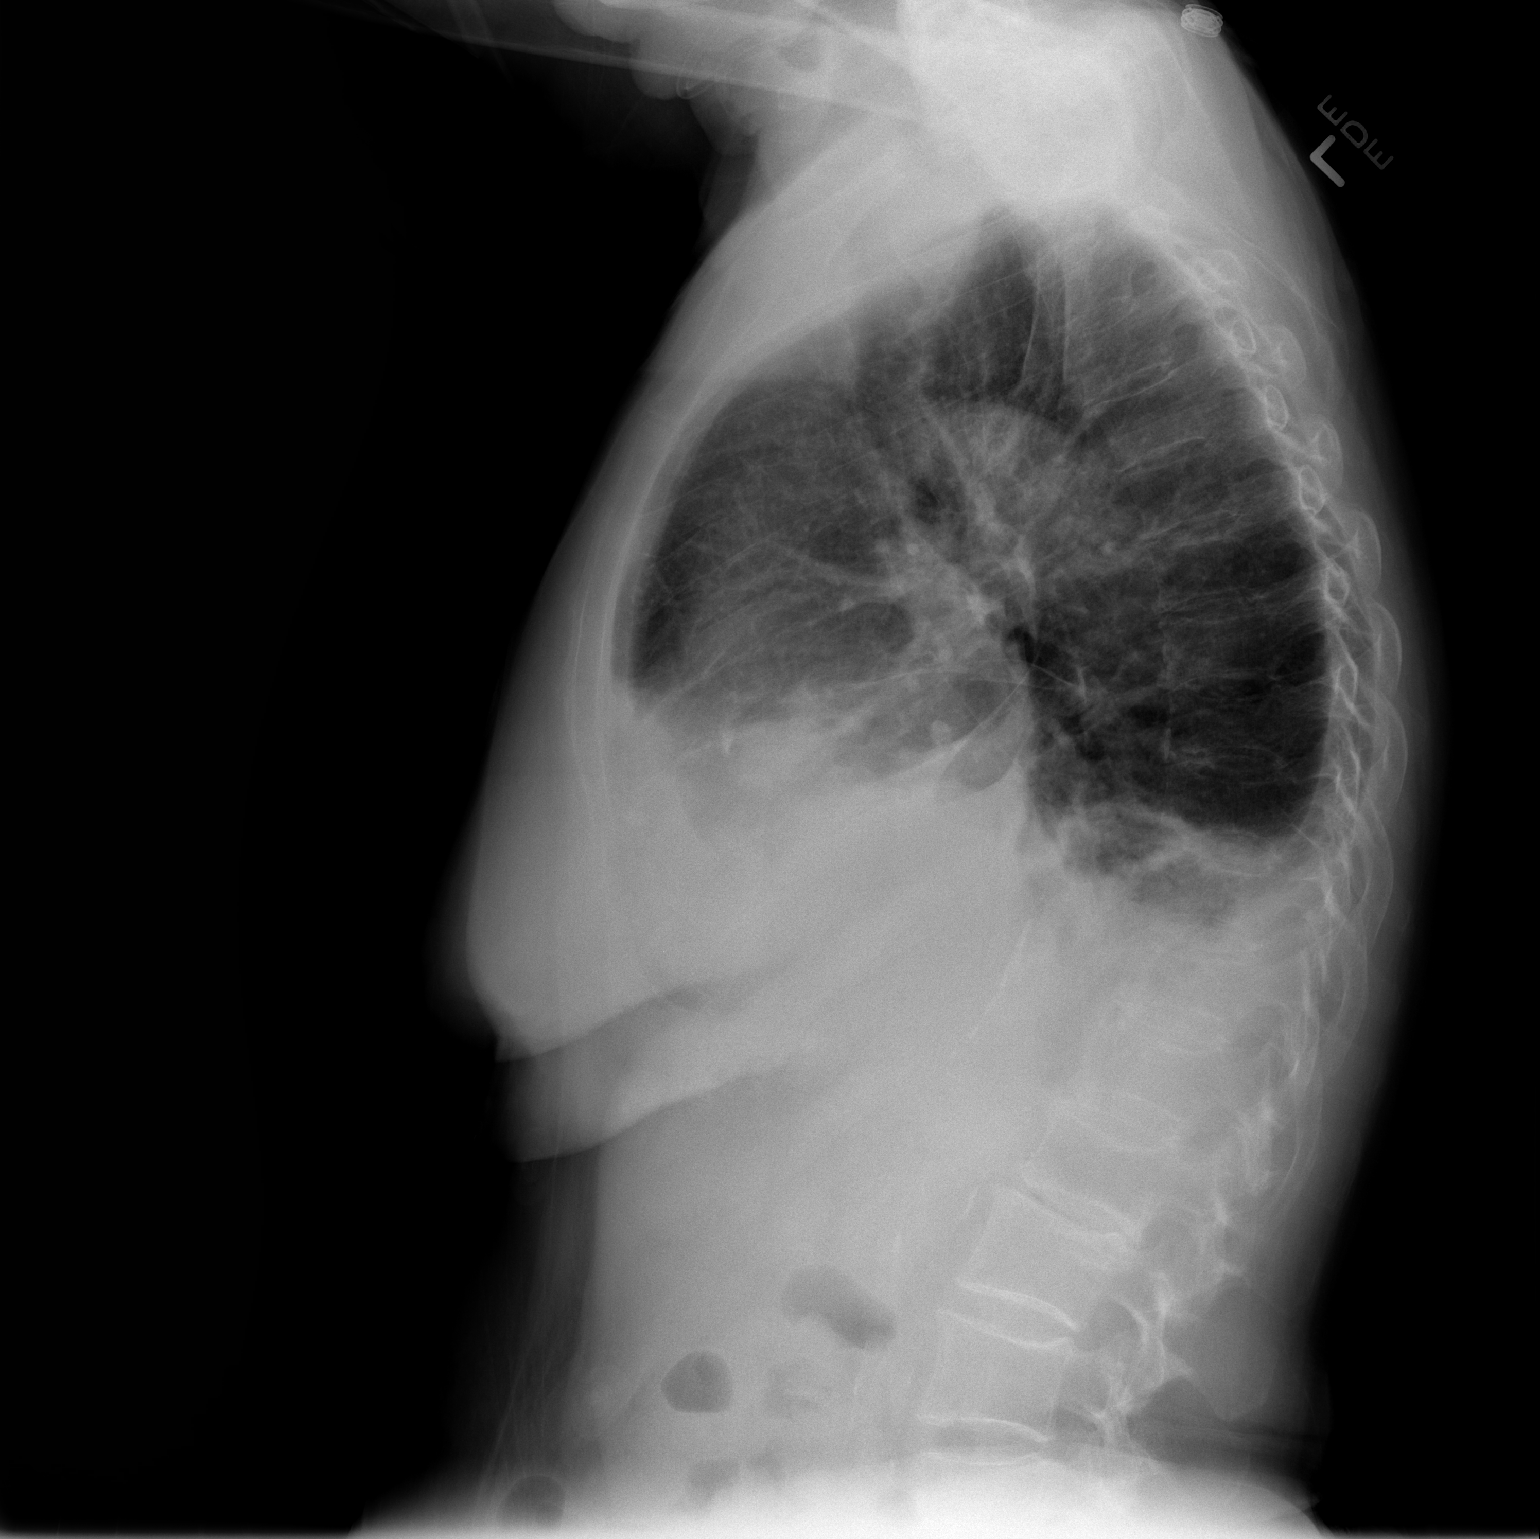

[2 of 2 positions shown; findings below may reference images not displayed]

FINDINGS: Mild cardiomegaly is noted.  Moderate bilateral pleural
effusions are noted with possible underlying pneumonia or
atelectasis.  Faint right upper lobe opacity is noted which may
represent pneumonia.
IMPRESSION: Moderate bilateral pleural effusions with possible underlying
pneumonia atelectasis.  Faint right upper lobe opacity which may
represent pneumonia; follow-up radiographs are recommended to
ensure resolution and rule out possible underlying neoplasm or
malignancy.

## 2013-11-22 DIAGNOSIS — M6281 Muscle weakness (generalized): Secondary | ICD-10-CM | POA: Diagnosis not present

## 2013-11-28 DIAGNOSIS — M6281 Muscle weakness (generalized): Secondary | ICD-10-CM | POA: Diagnosis not present

## 2013-12-04 ENCOUNTER — Encounter: Payer: Self-pay | Admitting: Cardiology

## 2013-12-04 ENCOUNTER — Encounter: Payer: Self-pay | Admitting: *Deleted

## 2013-12-24 ENCOUNTER — Ambulatory Visit: Payer: Federal, State, Local not specified - PPO | Admitting: Cardiology

## 2013-12-25 ENCOUNTER — Ambulatory Visit (INDEPENDENT_AMBULATORY_CARE_PROVIDER_SITE_OTHER): Payer: Medicare Other | Admitting: Cardiology

## 2013-12-25 ENCOUNTER — Encounter: Payer: Self-pay | Admitting: Cardiology

## 2013-12-25 VITALS — BP 186/94 | HR 81 | Ht 59.0 in | Wt 106.1 lb

## 2013-12-25 DIAGNOSIS — I5032 Chronic diastolic (congestive) heart failure: Secondary | ICD-10-CM

## 2013-12-25 DIAGNOSIS — I10 Essential (primary) hypertension: Secondary | ICD-10-CM | POA: Diagnosis not present

## 2013-12-25 DIAGNOSIS — I059 Rheumatic mitral valve disease, unspecified: Secondary | ICD-10-CM

## 2013-12-25 DIAGNOSIS — I340 Nonrheumatic mitral (valve) insufficiency: Secondary | ICD-10-CM | POA: Insufficient documentation

## 2013-12-25 NOTE — Progress Notes (Signed)
[facility]. [address] [place], [place]  [address] Phone: [phone] Fax:  [phone]  Date:  12/25/2013   ID:  Andrea [name], DOB [birth year], MRN [id number]  PCP:  Andrea Ser, MD   History of Present Illness: Andrea [name] is a 80 y.o. female with severe mitral regurgitation here for followup. She does have mild dyspnea on exertion when bending over, No change. She also states that when she lays on her side she feels some shortness of breath. When she lays flat on her back she feels better. She has been taking her Lasix 40 mg once a day and her weight has been stable. She does feel some shortness of breath when bending over. She is also noted that she has not had any significant lower extremity edema as she had previously. Her son is present with her. At prior visit we discussed DO NOT RESUSCITATE. Son is worried about gait, limp. She has done physical therapy but may need a renewal.    Wt Readings from Last 3 Encounters:  12/25/13 106 lb 1.9 oz (48.136 kg)  09/24/12 115 lb 8 oz (52.39 kg)     Past Medical History  Diagnosis Date  . Heart murmur   . Hypertension   . Osteoporosis   . Chronic diastolic heart failure     Past Surgical History  Procedure Laterality Date  . Cataract extraction      Current Outpatient Prescriptions  Medication Sig Dispense Refill  . aspirin EC 81 MG EC tablet Take 1 tablet (81 mg total) by mouth daily.      . Calcium Carbonate-Vit D-Min (CALCIUM 1200 PO) Take 1,200 mg by mouth daily.      . cholecalciferol (VITAMIN D) 1000 UNITS tablet Take 1,000 Units by mouth daily.      . furosemide (LASIX) 20 MG tablet Take 1 tablet (20 mg total) by mouth daily.  30 tablet  0  . Multiple Vitamin (MULTIVITAMIN WITH MINERALS) TABS Take 1 tablet by mouth daily.      . potassium chloride (K-DUR) 10 MEQ tablet Take 2 tablets (20 mEq total) by mouth daily.  30 tablet  0  . telmisartan (MICARDIS) 80 MG tablet Take 80 mg by mouth daily.        No current facility-administered medications for this visit.    Allergies:   No Known Allergies  Social History:  The patient  reports that she has never smoked. She does not have any smokeless tobacco history on file. She reports that she does not drink alcohol or use illicit drugs.   ROS:  Please see the history of present illness.   Denies any syncope, bleeding, orthopnea, PND. She does feel some mild shortness of breath when bending over.  PHYSICAL EXAM: VS:  BP 186/94  Pulse 81  Ht 4\' 11"  (1.499 m)  Wt 106 lb 1.9 oz (48.136 kg)  BMI 21.42 kg/m2 Well nourished, well developed, in no acute distress HEENT: normal Neck: no JVD Cardiac:  normal S1, S2; RRR; no murmur Lungs:  clear to auscultation bilaterally, no wheezing, rhonchi or rales Abd: soft, nontender, no hepatomegaly Ext: no edema Skin: warm and dry Neuro: no focal abnormalities noted  EKG:  Sinus rhythm heart rate 91 beats per minute with PVC, left axis deviation, left ventricular hypertrophy ECHO: 09/22/12:  - Left ventricle: The cavity size was normal. Wall thickness was normal. Systolic function was normal. The estimated ejection fraction was in the range of 55% to  60%. Wall motion was normal; there were no regional wall motion abnormalities. Features are consistent with a pseudonormal left ventricular filling pattern, with concomitant abnormal relaxation and increased filling pressure (grade 2 diastolic dysfunction). - Mitral valve: Calcified annulus. Mildly thickened leaflets . Prolapse, involving the posterior leaflet. Severe regurgitation. - Left atrium: The atrium was mildly dilated. - Tricuspid valve: Moderate regurgitation. - Pulmonary arteries: Systolic pressure was mildly to moderately increased. - Pericardium, extracardiac: There was a left pleural effusion.  ASSESSMENT AND PLAN:  1. Severe mitral regurgitation-continue with medical therapy. Discussed potential surgery in the past, not  pursuing surgical therapy. Currently stable. Does not seem to be deteriorating clinically. 2. Hypertension-currently elevated. No changes made. Has been intermittent with her medications. 3. Chronic diastolic heart failure-currently well compensated. No changes made. She has intermittently stopped taking her potassium supplementation. Tomorrow, she will be seeing Dr. Amedeo [name]. I encouraged her to check her basic metabolic profile.  Signed, Andrea Furbish, MD [facility]  12/25/2013 2:37 PM

## 2013-12-25 NOTE — Patient Instructions (Signed)
Your physician recommends that you continue on your current medications as directed. Please refer to the Current Medication list given to you today. * Please make sure you take all medications as prescribed.  Your physician wants you to follow-up in: 6 months with Dr. Marlou Porch. You will receive a reminder letter in the mail two months in advance. If you don't receive a letter, please call our office to schedule the follow-up appointment.

## 2013-12-26 DIAGNOSIS — Z0000 Encounter for general adult medical examination without abnormal findings: Secondary | ICD-10-CM | POA: Diagnosis not present

## 2013-12-26 DIAGNOSIS — Z1331 Encounter for screening for depression: Secondary | ICD-10-CM | POA: Diagnosis not present

## 2013-12-26 DIAGNOSIS — Z23 Encounter for immunization: Secondary | ICD-10-CM | POA: Diagnosis not present

## 2013-12-26 DIAGNOSIS — I10 Essential (primary) hypertension: Secondary | ICD-10-CM | POA: Diagnosis not present

## 2013-12-26 DIAGNOSIS — R5383 Other fatigue: Secondary | ICD-10-CM | POA: Diagnosis not present

## 2013-12-26 DIAGNOSIS — R5381 Other malaise: Secondary | ICD-10-CM | POA: Diagnosis not present

## 2014-01-09 DIAGNOSIS — M6281 Muscle weakness (generalized): Secondary | ICD-10-CM | POA: Diagnosis not present

## 2014-01-09 DIAGNOSIS — R269 Unspecified abnormalities of gait and mobility: Secondary | ICD-10-CM | POA: Diagnosis not present

## 2014-01-16 DIAGNOSIS — M6281 Muscle weakness (generalized): Secondary | ICD-10-CM | POA: Diagnosis not present

## 2014-01-16 DIAGNOSIS — R269 Unspecified abnormalities of gait and mobility: Secondary | ICD-10-CM | POA: Diagnosis not present

## 2014-01-30 DIAGNOSIS — R269 Unspecified abnormalities of gait and mobility: Secondary | ICD-10-CM | POA: Diagnosis not present

## 2014-01-30 DIAGNOSIS — M6281 Muscle weakness (generalized): Secondary | ICD-10-CM | POA: Diagnosis not present

## 2014-02-11 DIAGNOSIS — M6281 Muscle weakness (generalized): Secondary | ICD-10-CM | POA: Diagnosis not present

## 2014-02-11 DIAGNOSIS — R269 Unspecified abnormalities of gait and mobility: Secondary | ICD-10-CM | POA: Diagnosis not present

## 2014-06-26 DIAGNOSIS — I10 Essential (primary) hypertension: Secondary | ICD-10-CM | POA: Diagnosis not present

## 2014-06-26 DIAGNOSIS — M810 Age-related osteoporosis without current pathological fracture: Secondary | ICD-10-CM | POA: Diagnosis not present

## 2014-07-10 DIAGNOSIS — M810 Age-related osteoporosis without current pathological fracture: Secondary | ICD-10-CM | POA: Diagnosis not present

## 2014-07-22 ENCOUNTER — Other Ambulatory Visit: Payer: Self-pay | Admitting: Internal Medicine

## 2014-07-22 ENCOUNTER — Ambulatory Visit
Admission: RE | Admit: 2014-07-22 | Discharge: 2014-07-22 | Disposition: A | Discharge status: Home / Self Care | Payer: Medicare Other | Source: Ambulatory Visit | Attending: Internal Medicine | Admitting: Internal Medicine

## 2014-07-22 DIAGNOSIS — M810 Age-related osteoporosis without current pathological fracture: Secondary | ICD-10-CM

## 2014-07-22 DIAGNOSIS — S73006A Unspecified dislocation of unspecified hip, initial encounter: Secondary | ICD-10-CM | POA: Diagnosis not present

## 2014-09-11 DIAGNOSIS — Z23 Encounter for immunization: Secondary | ICD-10-CM | POA: Diagnosis not present

## 2014-12-30 ENCOUNTER — Ambulatory Visit
Admission: RE | Admit: 2014-12-30 | Discharge: 2014-12-30 | Disposition: A | Discharge status: Home / Self Care | Payer: Medicare Other | Source: Ambulatory Visit | Attending: Internal Medicine | Admitting: Internal Medicine

## 2014-12-30 ENCOUNTER — Other Ambulatory Visit: Payer: Self-pay | Admitting: Internal Medicine

## 2014-12-30 DIAGNOSIS — J209 Acute bronchitis, unspecified: Secondary | ICD-10-CM | POA: Diagnosis not present

## 2014-12-30 DIAGNOSIS — J40 Bronchitis, not specified as acute or chronic: Secondary | ICD-10-CM | POA: Diagnosis not present

## 2014-12-30 DIAGNOSIS — K449 Diaphragmatic hernia without obstruction or gangrene: Secondary | ICD-10-CM | POA: Diagnosis not present

## 2014-12-30 DIAGNOSIS — R0602 Shortness of breath: Secondary | ICD-10-CM | POA: Diagnosis not present

## 2014-12-30 DIAGNOSIS — I10 Essential (primary) hypertension: Secondary | ICD-10-CM | POA: Diagnosis not present

## 2014-12-30 DIAGNOSIS — I517 Cardiomegaly: Secondary | ICD-10-CM | POA: Diagnosis not present

## 2015-01-23 ENCOUNTER — Ambulatory Visit (INDEPENDENT_AMBULATORY_CARE_PROVIDER_SITE_OTHER): Payer: Medicare Other | Admitting: Cardiology

## 2015-01-23 ENCOUNTER — Encounter: Payer: Self-pay | Admitting: Cardiology

## 2015-01-23 VITALS — BP 130/84 | HR 73 | Ht 59.0 in | Wt 106.0 lb

## 2015-01-23 DIAGNOSIS — I444 Left anterior fascicular block: Secondary | ICD-10-CM

## 2015-01-23 DIAGNOSIS — I340 Nonrheumatic mitral (valve) insufficiency: Secondary | ICD-10-CM

## 2015-01-23 DIAGNOSIS — I10 Essential (primary) hypertension: Secondary | ICD-10-CM | POA: Diagnosis not present

## 2015-01-23 NOTE — Patient Instructions (Addendum)
The current medical regimen is effective;  continue present plan and medications.  Follow up in 6 months with Dr. Skains.  You will receive a letter in the mail 2 months before you are due.  Please call us when you receive this letter to schedule your follow up appointment.  Thank you for choosing [facility]!!     

## 2015-01-23 NOTE — Progress Notes (Signed)
[facility]. [address] [place], [place]  [address] Phone: [phone] Fax:  [phone]  Date:  01/23/2015   ID:  Andrea [name], DOB [birth year], MRN [id number]  PCP:  Cloyd Stagers, MD   History of Present Illness: Andrea [name] is a 81 y.o. female with severe mitral regurgitation here for followup. She does have mild dyspnea on exertion when bending over, No change. When she lays flat on her back she feels better. She has been taking her Lasix 20 mg once a day and her weight has been stable. She does feel some shortness of breath when bending over. She is also noted that she has not had any significant lower extremity edema as she had previously. Her son is present with her. At prior visit we discussed DO NOT RESUSCITATE. Has a hip dislocation, no surgery. She uses a cane for ambulation.    Wt Readings from Last 3 Encounters:  01/23/15 106 lb (48.081 kg)  12/25/13 106 lb 1.9 oz (48.136 kg)  09/24/12 115 lb 8 oz (52.39 kg)     Past Medical History  Diagnosis Date  . Heart murmur   . Hypertension   . Osteoporosis   . Chronic diastolic heart failure     Past Surgical History  Procedure Laterality Date  . Cataract extraction      Current Outpatient Prescriptions  Medication Sig Dispense Refill  . aspirin EC 81 MG EC tablet Take 1 tablet (81 mg total) by mouth daily.    . Calcium Carbonate-Vit D-Min (CALCIUM 1200 PO) Take 1,200 mg by mouth daily.    . cholecalciferol (VITAMIN D) 1000 UNITS tablet Take 1,000 Units by mouth daily.    . furosemide (LASIX) 20 MG tablet Take 1 tablet (20 mg total) by mouth daily. 30 tablet 0  . Multiple Vitamin (MULTIVITAMIN WITH MINERALS) TABS Take 1 tablet by mouth daily.    . potassium chloride (K-DUR) 10 MEQ tablet Take 2 tablets (20 mEq total) by mouth daily. 30 tablet 0  . telmisartan (MICARDIS) 80 MG tablet Take 80 mg by mouth daily.     No current facility-administered medications for this visit.     Allergies:   No Known Allergies  Social History:  The patient  reports that she has never smoked. She does not have any smokeless tobacco history on file. She reports that she does not drink alcohol or use illicit drugs.   ROS:  Please see the history of present illness.   Denies any syncope, bleeding, orthopnea, PND. She does feel some mild shortness of breath when bending over.  PHYSICAL EXAM: VS:  BP 130/84 mmHg  Pulse 73  Ht 4\' 11"  (1.499 m)  Wt 106 lb (48.081 kg)  BMI 21.40 kg/m2 Well nourished, well developed, in no acute distress HEENT: normal Neck: no JVD Cardiac:  normal S1, S2; RRR; Soft 1/6 HSM murmur Lungs:  clear to auscultation bilaterally, no wheezing, rhonchi or rales Abd: soft, nontender, no hepatomegaly Ext: no edema Skin: warm and dry Neuro: no focal abnormalities noted  EKG:  01/23/15-sinus rhythm, 73, left anterior fascicular block-previous Sinus rhythm heart rate 91 beats per minute with PVC, left axis deviation, left ventricular hypertrophy  ECHO: 09/22/12:  - Left ventricle: The cavity size was normal. Wall thickness was normal. Systolic function was normal. The estimated ejection fraction was in the range of 55% to 60%. Wall motion was normal; there were no regional wall motion abnormalities. Features are consistent with a  pseudonormal left ventricular filling pattern, with concomitant abnormal relaxation and increased filling pressure (grade 2 diastolic dysfunction). - Mitral valve: Calcified annulus. Mildly thickened leaflets . Prolapse, involving the posterior leaflet. Severe regurgitation. - Left atrium: The atrium was mildly dilated. - Tricuspid valve: Moderate regurgitation. - Pulmonary arteries: Systolic pressure was mildly to moderately increased. - Pericardium, extracardiac: There was a left pleural effusion.  ASSESSMENT AND PLAN:  1. Severe mitral regurgitation-continue with medical therapy. Discussed potential surgery in the past,  not pursuing surgical therapy. Currently stable. Does not seem to be deteriorating clinically. 2. Hypertension-currently elevated. No changes made. Has been intermittent with her medications. 3. Chronic diastolic heart failure-currently well compensated. No changes made. Taking low-dose furosemide 20 mg. Doing well.  4. Left anterior fascicular block-no adverse side effects such as syncope.   Signed, Candee Furbish, MD [facility]  01/23/2015 2:38 PM

## 2015-01-29 DIAGNOSIS — I5032 Chronic diastolic (congestive) heart failure: Secondary | ICD-10-CM | POA: Diagnosis not present

## 2015-01-29 DIAGNOSIS — Z1389 Encounter for screening for other disorder: Secondary | ICD-10-CM | POA: Diagnosis not present

## 2015-01-29 DIAGNOSIS — I340 Nonrheumatic mitral (valve) insufficiency: Secondary | ICD-10-CM | POA: Diagnosis not present

## 2015-01-29 DIAGNOSIS — I10 Essential (primary) hypertension: Secondary | ICD-10-CM | POA: Diagnosis not present

## 2015-01-29 DIAGNOSIS — Z0000 Encounter for general adult medical examination without abnormal findings: Secondary | ICD-10-CM | POA: Diagnosis not present

## 2015-01-29 DIAGNOSIS — M810 Age-related osteoporosis without current pathological fracture: Secondary | ICD-10-CM | POA: Diagnosis not present

## 2015-05-05 IMAGING — CR DG CHEST 2V
2 series · 2 of 2 positions shown · non-contrast
Comparison: None.

CLINICAL DATA: Bronchitis.

EXAM:
CHEST  2 VIEW

[view not recorded (1 of 2)]
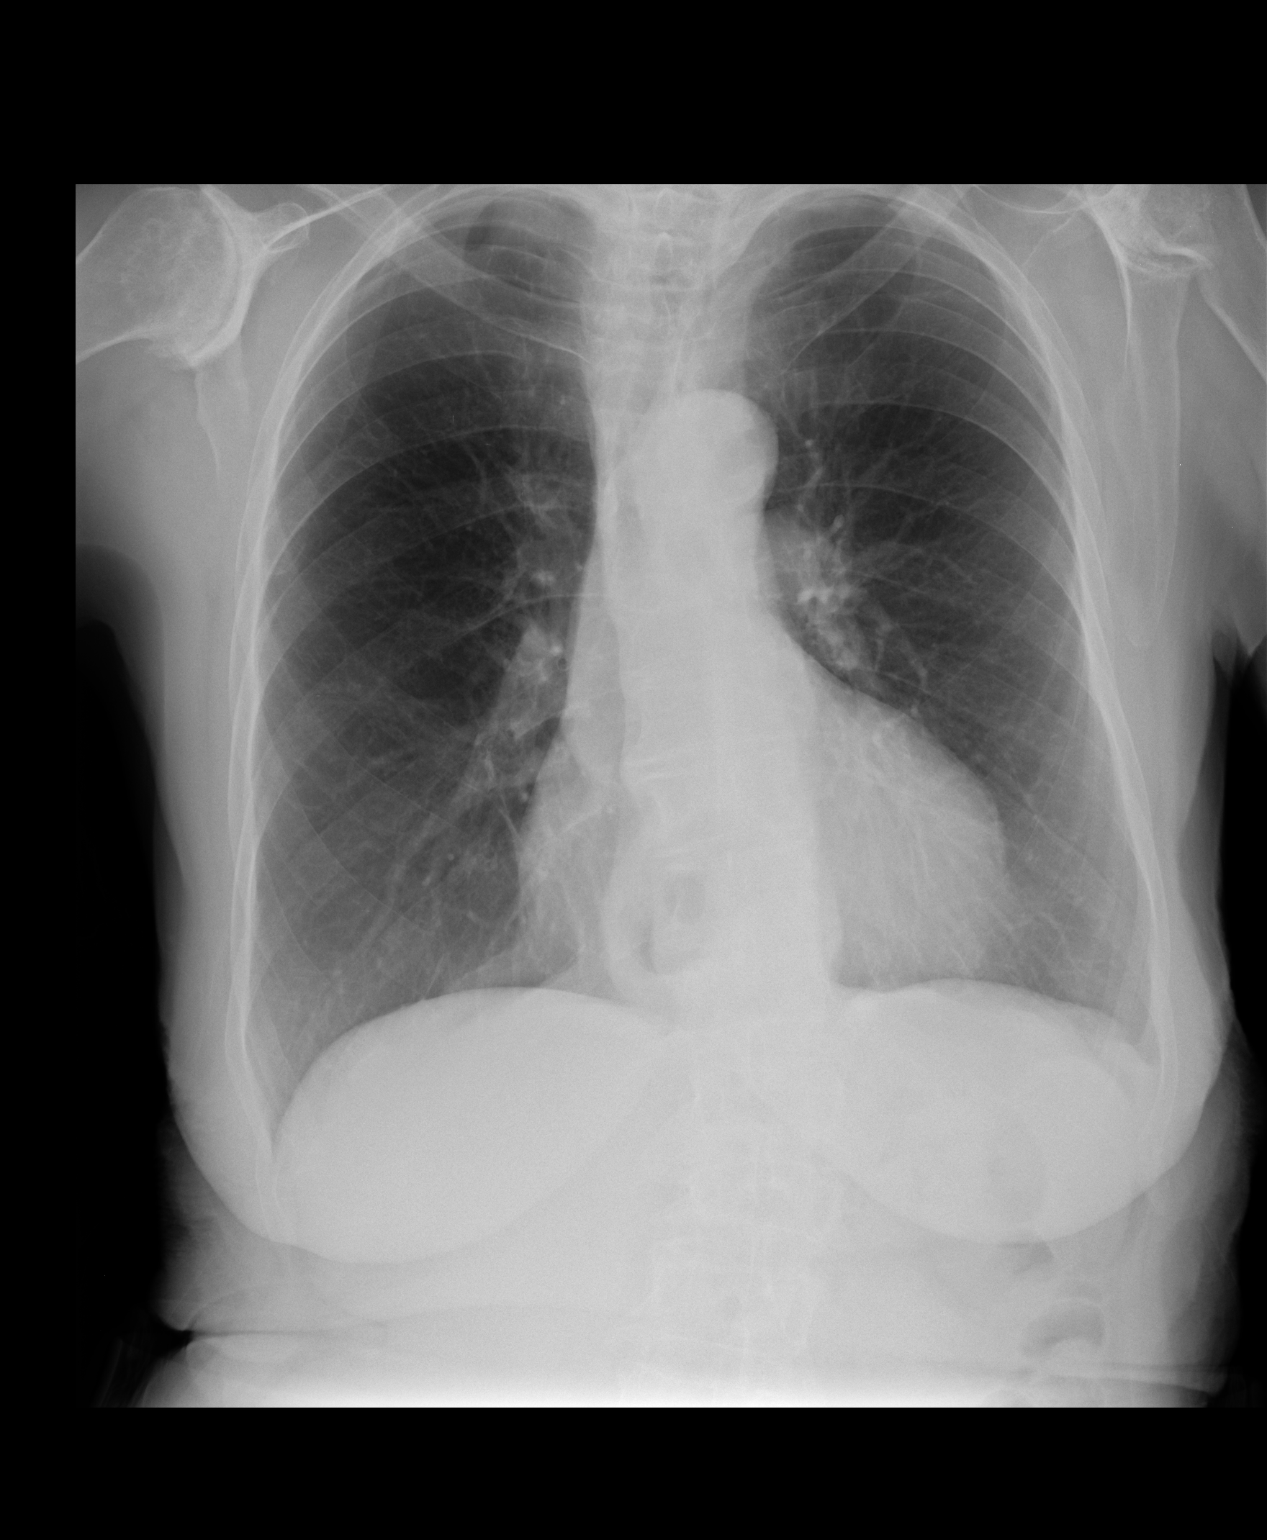

[view not recorded (2 of 2)]
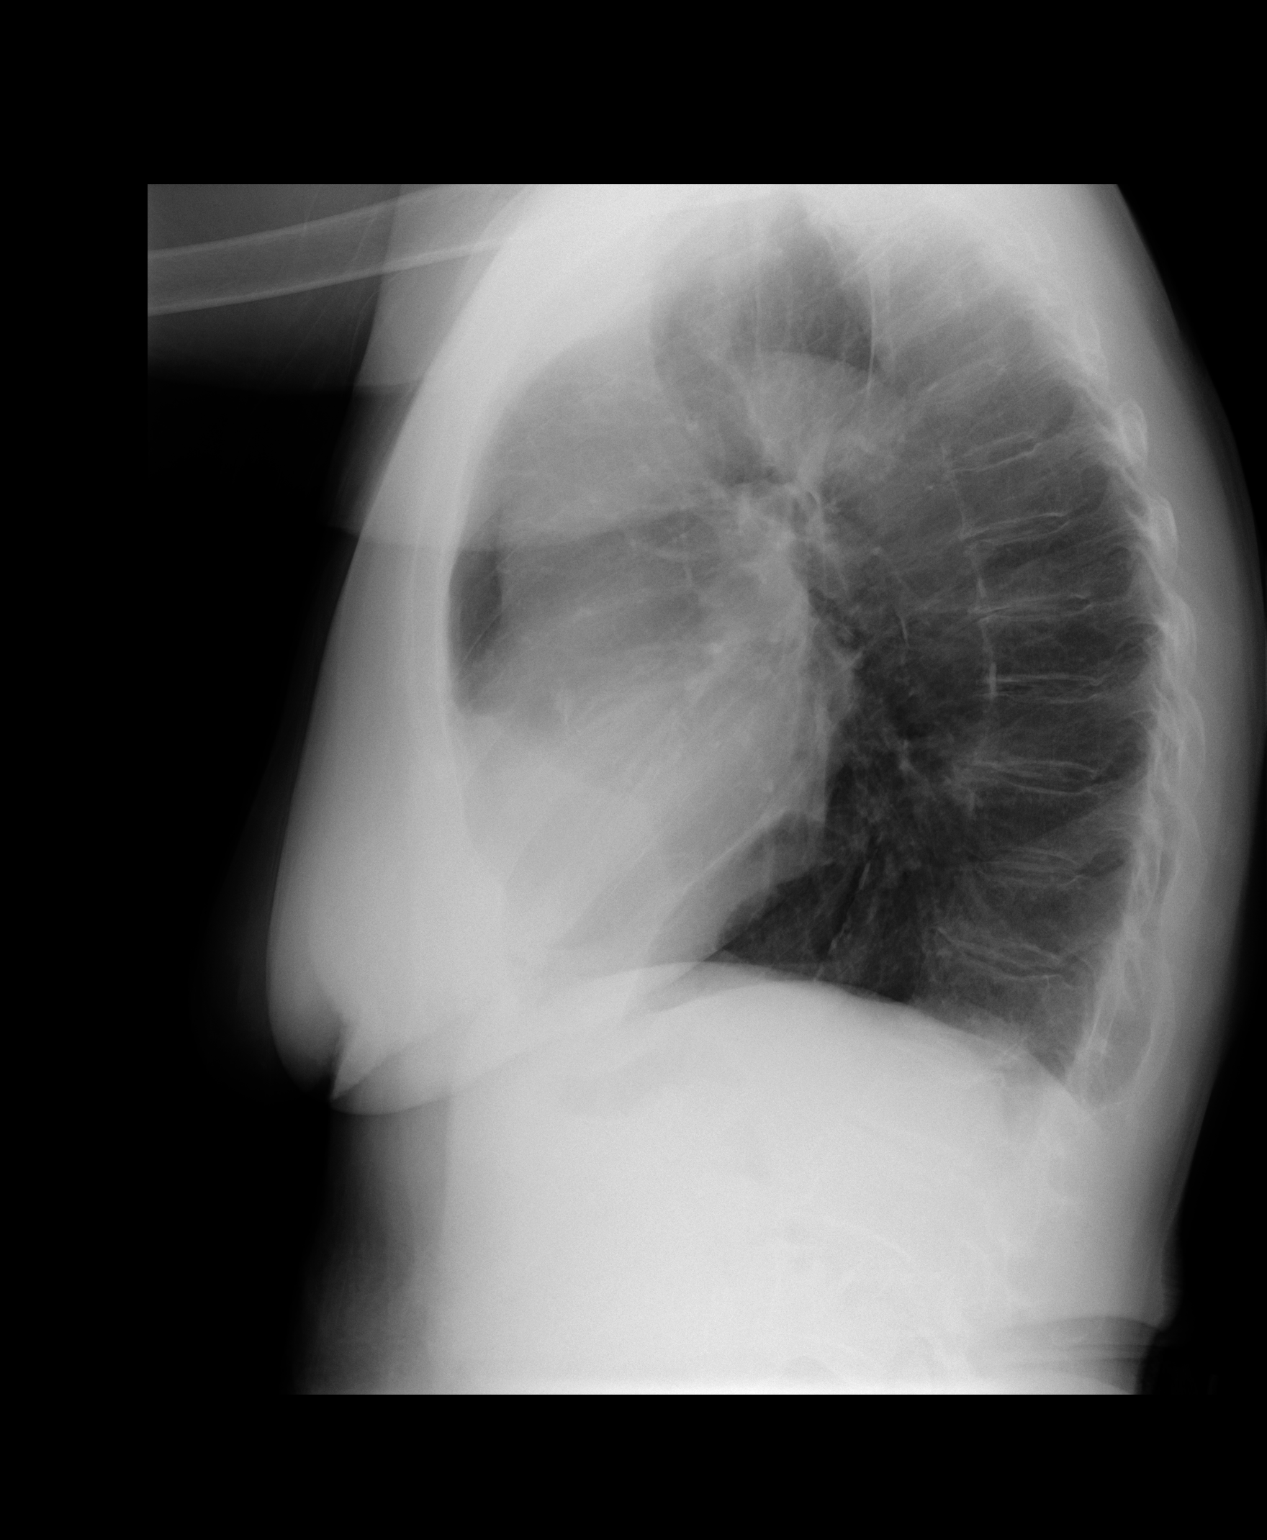

[2 of 2 positions shown; findings below may reference images not displayed]

FINDINGS: Mediastinum hilar structures normal. Lungs are clear. No pleural
effusion or pneumothorax. Cardiomegaly normal pulmonary vascularity.
No acute bony abnormality. Sliding hiatal hernia.
IMPRESSION: 1. Cardiomegaly.  No CHF.
2. No acute pulmonary disease.
3. Sliding hiatal hernia.

## 2015-07-28 ENCOUNTER — Encounter: Payer: Self-pay | Admitting: Cardiology

## 2015-07-28 ENCOUNTER — Ambulatory Visit (INDEPENDENT_AMBULATORY_CARE_PROVIDER_SITE_OTHER): Payer: Medicare Other | Admitting: Cardiology

## 2015-07-28 VITALS — BP 158/56 | HR 52 | Ht 59.0 in | Wt 103.8 lb

## 2015-07-28 DIAGNOSIS — I5032 Chronic diastolic (congestive) heart failure: Secondary | ICD-10-CM | POA: Diagnosis not present

## 2015-07-28 DIAGNOSIS — I340 Nonrheumatic mitral (valve) insufficiency: Secondary | ICD-10-CM

## 2015-07-28 DIAGNOSIS — I10 Essential (primary) hypertension: Secondary | ICD-10-CM

## 2015-07-28 NOTE — Progress Notes (Signed)
[facility]. [address] [place], [place]  [address] Phone: [phone] Fax:  [phone]  Date:  07/28/2015   ID:  Andrea [name], DOB 27-Oct-1920, MRN [id number]  PCP:  Kelton Pillar, Herschell Dimes, MD   History of Present Illness: Andrea [name] is a 81 y.o. female with severe mitral regurgitation here for followup. She does have mild dyspnea on exertion when bending over, No change. When she lays flat on her back she feels better. She has been taking her Lasix 20 mg once a day and her weight has been stable. She does feel some shortness of breath when bending over. She is also noted that she has not had any significant lower extremity edema as she had previously. Her son is present with her. At prior visit we discussed DO NOT RESUSCITATE. Has a hip dislocation, no surgery. She uses a cane for ambulation. She is still going down to the basement. She understands that this may be dangerous for her. Overall she is doing well.     Wt Readings from Last 3 Encounters:  07/28/15 103 lb 12.8 oz (47.083 kg)  01/23/15 106 lb (48.081 kg)  12/25/13 106 lb 1.9 oz (48.136 kg)     Past Medical History  Diagnosis Date  . Heart murmur   . Hypertension   . Osteoporosis   . Chronic diastolic heart failure     Past Surgical History  Procedure Laterality Date  . Cataract extraction      Current Outpatient Prescriptions  Medication Sig Dispense Refill  . aspirin EC 81 MG EC tablet Take 1 tablet (81 mg total) by mouth daily.    . Calcium Carbonate-Vit D-Min (CALCIUM 1200 PO) Take 1,200 mg by mouth daily.    . cholecalciferol (VITAMIN D) 1000 UNITS tablet Take 1,000 Units by mouth daily.    . furosemide (LASIX) 20 MG tablet Take 1 tablet (20 mg total) by mouth daily. 30 tablet 0  . Multiple Vitamin (MULTIVITAMIN WITH MINERALS) TABS Take 1 tablet by mouth daily.    . potassium chloride (K-DUR) 10 MEQ tablet Take 2 tablets (20 mEq total) by mouth daily. 30 tablet 0  . telmisartan  (MICARDIS) 80 MG tablet Take 80 mg by mouth daily.     No current facility-administered medications for this visit.    Allergies:   No Known Allergies  Social History:  The patient  reports that she has never smoked. She does not have any smokeless tobacco history on file. She reports that she does not drink alcohol or use illicit drugs.   ROS:  Please see the history of present illness.   Denies any syncope, bleeding, orthopnea, PND. She does feel some mild shortness of breath when bending over.  PHYSICAL EXAM: VS:  BP 158/56 mmHg  Pulse 52  Ht 4\' 11"  (1.499 m)  Wt 103 lb 12.8 oz (47.083 kg)  BMI 20.95 kg/m2 Well nourished, well developed, in no acute distress HEENT: normal Neck: no JVD Cardiac:  normal S1, S2; RRR; Soft 1/6 HSM murmur Lungs:  clear to auscultation bilaterally, no wheezing, rhonchi or rales Abd: soft, nontender, no hepatomegaly Ext: no edema Skin: warm and dry Neuro: no focal abnormalities noted  EKG:  01/23/15-sinus rhythm, 73, left anterior fascicular block-previous Sinus rhythm heart rate 91 beats per minute with PVC, left axis deviation, left ventricular hypertrophy  ECHO: 09/22/12:  - Left ventricle: The cavity size was normal. Wall thickness was normal. Systolic function was normal. The estimated ejection  fraction was in the range of 55% to 60%. Wall motion was normal; there were no regional wall motion abnormalities. Features are consistent with a pseudonormal left ventricular filling pattern, with concomitant abnormal relaxation and increased filling pressure (grade 2 diastolic dysfunction). - Mitral valve: Calcified annulus. Mildly thickened leaflets . Prolapse, involving the posterior leaflet. Severe regurgitation. - Left atrium: The atrium was mildly dilated. - Tricuspid valve: Moderate regurgitation. - Pulmonary arteries: Systolic pressure was mildly to moderately increased. - Pericardium, extracardiac: There was a left  pleural effusion.  ASSESSMENT AND PLAN:  1. Severe mitral regurgitation-continue with medical therapy. Discussed potential surgery in the past, not pursuing surgical therapy. Currently stable. Does not seem to be deteriorating clinically. 2. Hypertension-currently elevated. No changes made. Has been intermittent with her medications. We will check basic metabolic profile. 3. Chronic diastolic heart failure-currently well compensated. No changes made. Taking low-dose furosemide 20 mg. Doing well. Has trouble sometimes taking potassium. 4. Left anterior fascicular block-no adverse side effects such as syncope.  5. Six-month follow-up  Signed, Candee Furbish, MD [facility]  07/28/2015 2:04 PM

## 2015-07-28 NOTE — Patient Instructions (Signed)
Medication Instructions:  The current medical regimen is effective;  continue present plan and medications.  Labwork: Please have blood work drawn today. (BMP)  Follow-Up: Follow up in 6 months with Dr. Marlou Porch.  You will receive a letter in the mail 2 months before you are due.  Please call us when you receive this letter to schedule your follow up appointment.  Thank you for choosing [facility]!!

## 2015-07-29 LAB — BASIC METABOLIC PANEL
BUN: 22 mg/dL (ref 6–23)
CO2: 30 meq/L (ref 19–32)
CREATININE: 0.76 mg/dL (ref 0.40–1.20)
Calcium: 9.2 mg/dL (ref 8.4–10.5)
Chloride: 100 mEq/L (ref 96–112)
GFR: 75.54 mL/min (ref >60.00)
GLUCOSE: 91 mg/dL (ref 70–99)
Potassium: 4 mEq/L (ref 3.5–5.1)
SODIUM: 139 meq/L (ref 135–145)

## 2015-08-07 ENCOUNTER — Telehealth: Payer: Self-pay | Admitting: Cardiology

## 2015-08-07 NOTE — Telephone Encounter (Signed)
New Message  Pt son returning Pam's phone call. Please call back and discuss.

## 2015-08-07 NOTE — Telephone Encounter (Signed)
Left message on machine for pt's son to contact the office.

## 2015-08-07 NOTE — Telephone Encounter (Signed)
Pt's son aware of BMP results.

## 2015-08-07 NOTE — Telephone Encounter (Signed)
Follow up     Pt son returning call

## 2015-09-30 DIAGNOSIS — Z23 Encounter for immunization: Secondary | ICD-10-CM | POA: Diagnosis not present

## 2016-01-27 ENCOUNTER — Encounter: Payer: Self-pay | Admitting: Cardiology

## 2016-01-27 ENCOUNTER — Ambulatory Visit (INDEPENDENT_AMBULATORY_CARE_PROVIDER_SITE_OTHER): Payer: Medicare Other | Admitting: Cardiology

## 2016-01-27 VITALS — BP 144/82 | HR 72 | Ht 59.0 in | Wt 107.4 lb

## 2016-01-27 DIAGNOSIS — I340 Nonrheumatic mitral (valve) insufficiency: Secondary | ICD-10-CM

## 2016-01-27 DIAGNOSIS — I10 Essential (primary) hypertension: Secondary | ICD-10-CM

## 2016-01-27 DIAGNOSIS — I5032 Chronic diastolic (congestive) heart failure: Secondary | ICD-10-CM | POA: Diagnosis not present

## 2016-01-27 DIAGNOSIS — I444 Left anterior fascicular block: Secondary | ICD-10-CM

## 2016-01-27 NOTE — Patient Instructions (Signed)

## 2016-01-27 NOTE — Progress Notes (Signed)
[name]. [address] [place], [place]  [address] Phone: [phone] Fax:  [phone]  Date:  01/27/2016   ID:  Andrea [name], DOB February 07, 1920, MRN [id number]  PCP:  Kelton Pillar, Herschell Dimes, MD   History of Present Illness: Andrea [name] is a 82 y.o. female with severe mitral regurgitation here for followup. She does have mild dyspnea on exertion when bending over, No change. When she lays flat on her back she feels better. She has been taking her Lasix 20 mg once a day and her weight has been stable. She does feel some shortness of breath when bending over. She is also noted that she has not had any significant lower extremity edema as she had previously. Her son is present with her. At prior visit we discussed DO NOT RESUSCITATE. Has a hip dislocation, no surgery. She uses a cane for ambulation.  She is still going down to the basement. She understands that this may be dangerous for her.  Overall she is doing well. Her stamina has been decreasing. No significant change in shortness of breath. At times she does "pant". Lungs have been clear.    Wt Readings from Last 3 Encounters:  01/27/16 107 lb 6.4 oz (48.716 kg)  07/28/15 103 lb 12.8 oz (47.083 kg)  01/23/15 106 lb (48.081 kg)     Past Medical History  Diagnosis Date  . Heart murmur   . Hypertension   . Osteoporosis   . Chronic diastolic heart failure [facility])     Past Surgical History  Procedure Laterality Date  . Cataract extraction      Current Outpatient Prescriptions  Medication Sig Dispense Refill  . aspirin EC 81 MG EC tablet Take 1 tablet (81 mg total) by mouth daily.    . Calcium Carbonate-Vit D-Min (CALCIUM 1200 PO) Take 1,200 mg by mouth daily.    . cholecalciferol (VITAMIN D) 1000 UNITS tablet Take 1,000 Units by mouth daily.    . furosemide (LASIX) 20 MG tablet Take 1 tablet (20 mg total) by mouth daily. 30 tablet 0  . Multiple Vitamin (MULTIVITAMIN WITH MINERALS) TABS Take 1 tablet by  mouth daily.    . potassium chloride (K-DUR) 10 MEQ tablet Take 2 tablets (20 mEq total) by mouth daily. 30 tablet 0  . telmisartan (MICARDIS) 80 MG tablet Take 80 mg by mouth daily.     No current facility-administered medications for this visit.    Allergies:   No Known Allergies  Social History:  The patient  reports that she has never smoked. She does not have any smokeless tobacco history on file. She reports that she does not drink alcohol or use illicit drugs.   ROS:  Please see the history of present illness.   Denies any syncope, bleeding, orthopnea, PND. She does feel some mild shortness of breath when bending over.  PHYSICAL EXAM: VS:  BP 144/82 mmHg  Pulse 72  Ht 4\' 11"  (1.499 m)  Wt 107 lb 6.4 oz (48.716 kg)  BMI 21.68 kg/m2 Well nourished, well developed, in no acute distress HEENT: normal Neck: no JVD Cardiac:  normal S1, S2; RRR; Soft 1/6 HSM murmur Lungs:  clear to auscultation bilaterally, no wheezing, rhonchi or rales Abd: soft, nontender, no hepatomegaly Ext: no edema Skin: warm and dry Neuro: no focal abnormalities noted  EKG:  Order was placed today 01/27/16-sinus rhythm, occasional PVCs, left anterior fascicular block. 01/23/15-sinus rhythm, 73, left anterior fascicular block-previous Sinus rhythm heart rate  91 beats per minute with PVC, left axis deviation, left ventricular hypertrophy  ECHO: 09/22/12:  - Left ventricle: The cavity size was normal. Wall thickness was normal. Systolic function was normal. The estimated ejection fraction was in the range of 55% to 60%. Wall motion was normal; there were no regional wall motion abnormalities. Features are consistent with a pseudonormal left ventricular filling pattern, with concomitant abnormal relaxation and increased filling pressure (grade 2 diastolic dysfunction). - Mitral valve: Calcified annulus. Mildly thickened leaflets . Prolapse, involving the posterior leaflet. Severe regurgitation. - Left  atrium: The atrium was mildly dilated. - Tricuspid valve: Moderate regurgitation. - Pulmonary arteries: Systolic pressure was mildly to moderately increased. - Pericardium, extracardiac: There was a left pleural effusion.  ASSESSMENT AND PLAN:  1. Severe mitral regurgitation-continue with medical therapy. Discussed potential surgery in the past, not pursuing surgical therapy. Currently stable. Does not seem to be deteriorating clinically. she does however have decreased stamina. Unfortunately unable to to have hip surgery.  2. Hypertension-currently elevated. No changes made. Has been intermittent with her medications. We will check basic metabolic profile. 3. Chronic diastolic heart failure-currently well compensated. No changes made. Taking low-dose furosemide 20 mg. Doing well. Has trouble sometimes taking potassium. 4. Left anterior fascicular block-no adverse side effects such as syncope.  5. Six-month follow-up  Signed, Candee Furbish, MD [facility]  01/27/2016 2:23 PM

## 2016-02-06 DIAGNOSIS — I340 Nonrheumatic mitral (valve) insufficiency: Secondary | ICD-10-CM | POA: Diagnosis not present

## 2016-02-06 DIAGNOSIS — Z1389 Encounter for screening for other disorder: Secondary | ICD-10-CM | POA: Diagnosis not present

## 2016-02-06 DIAGNOSIS — I5032 Chronic diastolic (congestive) heart failure: Secondary | ICD-10-CM | POA: Diagnosis not present

## 2016-02-06 DIAGNOSIS — S73004S Unspecified dislocation of right hip, sequela: Secondary | ICD-10-CM | POA: Diagnosis not present

## 2016-02-06 DIAGNOSIS — Z0000 Encounter for general adult medical examination without abnormal findings: Secondary | ICD-10-CM | POA: Diagnosis not present

## 2016-02-06 DIAGNOSIS — I10 Essential (primary) hypertension: Secondary | ICD-10-CM | POA: Diagnosis not present

## 2016-07-26 ENCOUNTER — Encounter: Payer: Self-pay | Admitting: Cardiology

## 2016-07-26 ENCOUNTER — Ambulatory Visit (INDEPENDENT_AMBULATORY_CARE_PROVIDER_SITE_OTHER): Payer: Medicare Other | Admitting: Cardiology

## 2016-07-26 VITALS — BP 124/82 | HR 72 | Ht 60.0 in | Wt 104.0 lb

## 2016-07-26 DIAGNOSIS — I444 Left anterior fascicular block: Secondary | ICD-10-CM | POA: Diagnosis not present

## 2016-07-26 DIAGNOSIS — I10 Essential (primary) hypertension: Secondary | ICD-10-CM | POA: Diagnosis not present

## 2016-07-26 DIAGNOSIS — I340 Nonrheumatic mitral (valve) insufficiency: Secondary | ICD-10-CM

## 2016-07-26 NOTE — Progress Notes (Signed)
[facility]. [address] [place], [place]  [address] Phone: [phone] Fax:  [phone]  Date:  07/26/2016   ID:  Andrea [name], DOB December 25, 1919, MRN [id number]  PCP:  Andrea Roup, MD   History of Present Illness: Andrea [name] is a 82 y.o. female with severe mitral regurgitation here for followup. She does have mild dyspnea on exertion when bending over, No change. When she lays flat on her back she feels better. She has been taking her Lasix 20 mg once a day and her weight has been stable. She does feel some shortness of breath when bending over. She is also noted that she has not had any significant lower extremity edema as she had previously. Her son is present with her. At prior visit we discussed DO NOT RESUSCITATE. Has a hip dislocation, no surgery. She uses a cane for ambulation.  She is still going down to the basement. She understands that this may be dangerous for her.  Overall she is doing well. Her stamina has been decreasing. No significant change in shortness of breath. At times she does "pant". Lungs have been clear.    Wt Readings from Last 3 Encounters:  07/26/16 104 lb (47.2 kg)  01/27/16 107 lb 6.4 oz (48.7 kg)  07/28/15 103 lb 12.8 oz (47.1 kg)     Past Medical History:  Diagnosis Date  . Chronic diastolic heart failure ([place])   . Heart murmur   . Hypertension   . Osteoporosis     Past Surgical History:  Procedure Laterality Date  . CATARACT EXTRACTION      Current Outpatient Prescriptions  Medication Sig Dispense Refill  . aspirin EC 81 MG EC tablet Take 1 tablet (81 mg total) by mouth daily.    . Calcium Carbonate-Vit D-Min (CALCIUM 1200 PO) Take 1,200 mg by mouth daily.    . cholecalciferol (VITAMIN D) 1000 UNITS tablet Take 1,000 Units by mouth daily.    . furosemide (LASIX) 20 MG tablet Take 1 tablet (20 mg total) by mouth daily. 30 tablet 0  . Multiple Vitamin (MULTIVITAMIN WITH MINERALS) TABS Take 1 tablet by mouth daily.     . potassium chloride (K-DUR) 10 MEQ tablet Take 2 tablets (20 mEq total) by mouth daily. 30 tablet 0  . telmisartan (MICARDIS) 80 MG tablet Take 80 mg by mouth daily.     No current facility-administered medications for this visit.     Allergies:   No Known Allergies  Social History:  The patient  reports that she has never smoked. She does not have any smokeless tobacco history on file. She reports that she does not drink alcohol or use drugs.   ROS:  Please see the history of present illness.   Denies any syncope, bleeding, orthopnea, PND. She does feel some mild shortness of breath when bending over.  PHYSICAL EXAM: VS:  BP 124/82   Pulse 72   Ht 5' (1.524 m)   Wt 104 lb (47.2 kg)   BMI 20.31 kg/m  Well nourished, well developed, in no acute distress HEENT: normal Neck: no JVD Cardiac:  normal S1, S2; RRR; Soft 1/6 HSM murmur Lungs:  clear to auscultation bilaterally, no wheezing, rhonchi or rales Abd: soft, nontender, no hepatomegaly Ext: no edema Skin: warm and dry Neuro: no focal abnormalities noted  EKG:  Order was placed today 01/27/16-sinus rhythm, occasional PVCs, left anterior fascicular block. 01/23/15-sinus rhythm, 73, left anterior fascicular block-previous Sinus rhythm heart  rate 91 beats per minute with PVC, left axis deviation, left ventricular hypertrophy  ECHO: 09/22/12:  - Left ventricle: The cavity size was normal. Wall thickness was normal. Systolic function was normal. The estimated ejection fraction was in the range of 55% to 60%. Wall motion was normal; there were no regional wall motion abnormalities. Features are consistent with a pseudonormal left ventricular filling pattern, with concomitant abnormal relaxation and increased filling pressure (grade 2 diastolic dysfunction). - Mitral valve: Calcified annulus. Mildly thickened leaflets . Prolapse, involving the posterior leaflet. Severe regurgitation. - Left atrium: The atrium was mildly  dilated. - Tricuspid valve: Moderate regurgitation. - Pulmonary arteries: Systolic pressure was mildly to moderately increased. - Pericardium, extracardiac: There was a left pleural effusion.  ASSESSMENT AND PLAN:  1. Severe mitral regurgitation-continue with medical therapy. Discussed potential surgery in the past, not pursuing surgical therapy. Currently stable. Does not seem to be deteriorating clinically. she does however have decreased stamina. She has been doing amazingly well. Her son takes good care of her. Unfortunately unable to to have hip surgery.  2. Hypertension-currently elevated. No changes made.  3. Chronic diastolic heart failure-currently well compensated. No changes made. Taking low-dose furosemide 20 mg. Doing well. Has trouble sometimes taking potassium. 4. Left anterior fascicular block-no adverse side effects such as syncope.  5. Six-month follow-up  Signed, Andrea Furbish, MD [facility]  07/26/2016 2:07 PM

## 2016-07-26 NOTE — Patient Instructions (Signed)
Medication Instructions:  The current medical regimen is effective;  continue present plan and medications.  Follow-Up: Follow up in 6 months with Dr. Marlou Porch.  You will receive a letter in the mail 2 months before you are due.  Please call us when you receive this letter to schedule your follow up appointment.  Any Other Special Instructions Will Be Listed Below (If Applicable).  If you need a refill on your cardiac medications before your next appointment, please call your pharmacy.  Thank you for choosing [facility]!!

## 2016-09-01 DIAGNOSIS — Z23 Encounter for immunization: Secondary | ICD-10-CM | POA: Diagnosis not present

## 2016-09-07 DIAGNOSIS — I5032 Chronic diastolic (congestive) heart failure: Secondary | ICD-10-CM | POA: Diagnosis not present

## 2016-09-07 DIAGNOSIS — M818 Other osteoporosis without current pathological fracture: Secondary | ICD-10-CM | POA: Diagnosis not present

## 2016-09-07 DIAGNOSIS — I10 Essential (primary) hypertension: Secondary | ICD-10-CM | POA: Diagnosis not present

## 2016-09-07 DIAGNOSIS — I340 Nonrheumatic mitral (valve) insufficiency: Secondary | ICD-10-CM | POA: Diagnosis not present

## 2016-09-07 DIAGNOSIS — R609 Edema, unspecified: Secondary | ICD-10-CM | POA: Diagnosis not present

## 2016-09-07 DIAGNOSIS — M1990 Unspecified osteoarthritis, unspecified site: Secondary | ICD-10-CM | POA: Diagnosis not present

## 2017-01-25 ENCOUNTER — Ambulatory Visit (INDEPENDENT_AMBULATORY_CARE_PROVIDER_SITE_OTHER): Payer: Medicare Other | Admitting: Cardiology

## 2017-01-25 ENCOUNTER — Encounter: Payer: Self-pay | Admitting: Cardiology

## 2017-01-25 VITALS — BP 128/82 | HR 70 | Ht 59.0 in | Wt 108.8 lb

## 2017-01-25 DIAGNOSIS — I340 Nonrheumatic mitral (valve) insufficiency: Secondary | ICD-10-CM | POA: Diagnosis not present

## 2017-01-25 DIAGNOSIS — I5032 Chronic diastolic (congestive) heart failure: Secondary | ICD-10-CM | POA: Diagnosis not present

## 2017-01-25 DIAGNOSIS — I10 Essential (primary) hypertension: Secondary | ICD-10-CM

## 2017-01-25 DIAGNOSIS — I444 Left anterior fascicular block: Secondary | ICD-10-CM | POA: Diagnosis not present

## 2017-01-25 MED ORDER — TELMISARTAN 80 MG PO TABS: 80.0000 mg | ORAL_TABLET | Freq: Every day | ORAL | 3 refills | Status: DC

## 2017-01-25 NOTE — Progress Notes (Signed)
[facility]. [address] [place], [place]  [address] Phone: [phone] Fax:  [phone]  Date:  01/25/2017   ID:  Franziska Strength, DOB 11/03/1920, MRN [id number]  PCP:  Leeroy Cha, MD   History of Present Illness: Andrea [name] is a 83 y.o. female with severe mitral regurgitation here for followup. She does have mild dyspnea on exertion when bending over, no change. Sometimes she feels as though she does not have the same amount of energy as she did previously. She says that sometimes she is "lazy ". When she lays flat on her back she feels better. She has been taking her Lasix 20 mg once a day and her weight has been stable. She is also noted that she has not had any significant lower extremity edema as she had previously. Her son is present with her.   At prior visit we discussed DO NOT RESUSCITATE. Has a hip dislocation, no surgery. She uses a cane for ambulation.  She is still going down to the basement. She understands that this may be dangerous for her.  Overall she is doing well. Her stamina has been decreasing. No significant change in shortness of breath. At times she does "pant". Lungs have been clear.    Wt Readings from Last 3 Encounters:  01/25/17 108 lb 12.8 oz (49.4 kg)  07/26/16 104 lb (47.2 kg)  01/27/16 107 lb 6.4 oz (48.7 kg)     Past Medical History:  Diagnosis Date  . Chronic diastolic heart failure ([place])   . Heart murmur   . Hypertension   . Osteoporosis     Past Surgical History:  Procedure Laterality Date  . CATARACT EXTRACTION      Current Outpatient Prescriptions  Medication Sig Dispense Refill  . aspirin EC 81 MG EC tablet Take 1 tablet (81 mg total) by mouth daily.    . Calcium Carbonate-Vit D-Min (CALCIUM 1200 PO) Take 1,200 mg by mouth daily.    . cholecalciferol (VITAMIN D) 1000 UNITS tablet Take 1,000 Units by mouth daily.    . furosemide (LASIX) 20 MG tablet Take 1 tablet (20 mg total) by mouth daily. 30 tablet 0    . Multiple Vitamin (MULTIVITAMIN WITH MINERALS) TABS Take 1 tablet by mouth daily.    . potassium chloride (K-DUR) 10 MEQ tablet Take 2 tablets (20 mEq total) by mouth daily. 30 tablet 0  . telmisartan (MICARDIS) 80 MG tablet Take 1 tablet (80 mg total) by mouth daily. 90 tablet 3   No current facility-administered medications for this visit.     Allergies:   No Known Allergies  Social History:  The patient  reports that she has never smoked. She has never used smokeless tobacco. She reports that she does not drink alcohol or use drugs.   ROS:  Please see the history of present illness.   Denies any syncope, bleeding, orthopnea, PND. She does feel some mild shortness of breath when bending over.  PHYSICAL EXAM: VS:  BP 128/82   Pulse 70   Ht 4\' 11"  (1.499 m)   Wt 108 lb 12.8 oz (49.4 kg)   LMP  (LMP Unknown)   BMI 21.97 kg/m  Well nourished, well developed, in no acute distress HEENT: normal Neck: no JVD Cardiac:  normal S1, S2; RRR; Soft 1/6 HSM murmur Lungs:  clear to auscultation bilaterally, no wheezing, rhonchi or rales Abd: soft, nontender, no hepatomegaly Ext: no edema Skin: warm and dry Neuro: no focal  abnormalities noted  EKG:  Order was placed today 01/27/16-sinus rhythm, occasional PVCs, left anterior fascicular block. 01/23/15-sinus rhythm, 73, left anterior fascicular block-previous Sinus rhythm heart rate 91 beats per minute with PVC, left axis deviation, left ventricular hypertrophy  ECHO: 09/22/12:  - Left ventricle: The cavity size was normal. Wall thickness was normal. Systolic function was normal. The estimated ejection fraction was in the range of 55% to 60%. Wall motion was normal; there were no regional wall motion abnormalities. Features are consistent with a pseudonormal left ventricular filling pattern, with concomitant abnormal relaxation and increased filling pressure (grade 2 diastolic dysfunction). - Mitral valve: Calcified annulus. Mildly  thickened leaflets . Prolapse, involving the posterior leaflet. Severe regurgitation. - Left atrium: The atrium was mildly dilated. - Tricuspid valve: Moderate regurgitation. - Pulmonary arteries: Systolic pressure was mildly to moderately increased. - Pericardium, extracardiac: There was a left pleural effusion.  ASSESSMENT AND PLAN:  1. Severe mitral regurgitation-continue with medical therapy. Discussed potential surgery in the past, not pursuing surgical therapy. Currently stable. Does not seem to be deteriorating clinically. She does however have decreased stamina. She has been doing amazingly well. Her son takes good care of her. Unfortunately unable to to have hip surgery.  2. Hypertension-currently well controlled. No changes made.  3. Chronic diastolic heart failure-currently well compensated. No changes made. Taking low-dose furosemide 20 mg. Doing well. Has trouble sometimes taking potassium. Her primary physician has been following her lab work. She just had a Medicare physical area 4. Left anterior fascicular block-no adverse side effects such as syncope.  5. Six-month follow-up  Signed, Candee Furbish, MD [facility]  01/25/2017 3:42 PM

## 2017-01-25 NOTE — Patient Instructions (Signed)

## 2017-02-21 DIAGNOSIS — Z23 Encounter for immunization: Secondary | ICD-10-CM | POA: Diagnosis not present

## 2017-02-21 DIAGNOSIS — I10 Essential (primary) hypertension: Secondary | ICD-10-CM | POA: Diagnosis not present

## 2017-02-21 DIAGNOSIS — M1990 Unspecified osteoarthritis, unspecified site: Secondary | ICD-10-CM | POA: Diagnosis not present

## 2017-02-21 DIAGNOSIS — M818 Other osteoporosis without current pathological fracture: Secondary | ICD-10-CM | POA: Diagnosis not present

## 2017-02-21 DIAGNOSIS — Z1389 Encounter for screening for other disorder: Secondary | ICD-10-CM | POA: Diagnosis not present

## 2017-02-21 DIAGNOSIS — E785 Hyperlipidemia, unspecified: Secondary | ICD-10-CM | POA: Diagnosis not present

## 2017-02-21 DIAGNOSIS — I5032 Chronic diastolic (congestive) heart failure: Secondary | ICD-10-CM | POA: Diagnosis not present

## 2017-02-21 DIAGNOSIS — Z0000 Encounter for general adult medical examination without abnormal findings: Secondary | ICD-10-CM | POA: Diagnosis not present

## 2017-02-21 DIAGNOSIS — I340 Nonrheumatic mitral (valve) insufficiency: Secondary | ICD-10-CM | POA: Diagnosis not present

## 2017-03-30 DIAGNOSIS — I10 Essential (primary) hypertension: Secondary | ICD-10-CM | POA: Diagnosis not present

## 2017-03-30 DIAGNOSIS — K921 Melena: Secondary | ICD-10-CM | POA: Diagnosis not present

## 2017-03-30 DIAGNOSIS — I5032 Chronic diastolic (congestive) heart failure: Secondary | ICD-10-CM | POA: Diagnosis not present

## 2017-04-29 DIAGNOSIS — W19XXXA Unspecified fall, initial encounter: Secondary | ICD-10-CM | POA: Diagnosis not present

## 2017-04-29 DIAGNOSIS — I5032 Chronic diastolic (congestive) heart failure: Secondary | ICD-10-CM | POA: Diagnosis not present

## 2017-04-29 DIAGNOSIS — M1990 Unspecified osteoarthritis, unspecified site: Secondary | ICD-10-CM | POA: Diagnosis not present

## 2017-04-29 DIAGNOSIS — E785 Hyperlipidemia, unspecified: Secondary | ICD-10-CM | POA: Diagnosis not present

## 2017-08-24 DIAGNOSIS — Z23 Encounter for immunization: Secondary | ICD-10-CM | POA: Diagnosis not present

## 2017-08-24 DIAGNOSIS — M818 Other osteoporosis without current pathological fracture: Secondary | ICD-10-CM | POA: Diagnosis not present

## 2017-08-24 DIAGNOSIS — I10 Essential (primary) hypertension: Secondary | ICD-10-CM | POA: Diagnosis not present

## 2017-08-24 DIAGNOSIS — I5032 Chronic diastolic (congestive) heart failure: Secondary | ICD-10-CM | POA: Diagnosis not present

## 2017-09-16 DIAGNOSIS — L209 Atopic dermatitis, unspecified: Secondary | ICD-10-CM | POA: Diagnosis not present

## 2018-01-09 ENCOUNTER — Ambulatory Visit (INDEPENDENT_AMBULATORY_CARE_PROVIDER_SITE_OTHER): Payer: Medicare Other | Admitting: Podiatry

## 2018-01-09 ENCOUNTER — Encounter: Payer: Self-pay | Admitting: Podiatry

## 2018-01-09 DIAGNOSIS — B351 Tinea unguium: Secondary | ICD-10-CM

## 2018-01-09 DIAGNOSIS — M79676 Pain in unspecified toe(s): Secondary | ICD-10-CM

## 2018-01-09 DIAGNOSIS — L989 Disorder of the skin and subcutaneous tissue, unspecified: Secondary | ICD-10-CM | POA: Diagnosis not present

## 2018-01-09 NOTE — Progress Notes (Signed)
   Subjective:    Patient ID: Andrea [name], female    DOB: [birth year], 84 y.o.   MRN: [id number]  HPI    Review of Systems  All other systems reviewed and are negative.      Objective:   Physical Exam        Assessment & Plan:

## 2018-01-11 NOTE — Progress Notes (Signed)
    Subjective: Patient is a 84 y.o. female presenting to the office today as a new patient with a chief complaint of painful callus lesions to the bilateral great toes that have been present for several weeks. She is also concerned about a possible corm to the third toe of the left foot. Patient also complains of elongated, thickened nails that cause pain while ambulating in shoes. Patient is unable to trim their own nails. Patient presents today for further treatment and evaluation.  Past Medical History:  Diagnosis Date  . Chronic diastolic heart failure ([place])   . Heart murmur   . Hypertension   . Osteoporosis     Objective:  Physical Exam General: Alert and oriented x3 in no acute distress  Dermatology: Hyperkeratotic lesions present on the bilateral great toes. Pain on palpation with a central nucleated core noted. Skin is warm, dry and supple bilateral lower extremities. Negative for open lesions or macerations. Nails are tender, long, thickened and dystrophic with subungual debris, consistent with onychomycosis, 1-5 bilateral. No signs of infection noted.  Vascular: Palpable pedal pulses bilaterally. No edema or erythema noted. Capillary refill within normal limits.  Neurological: Epicritic and protective threshold grossly intact bilaterally.   Musculoskeletal Exam: Pain on palpation at the keratotic lesion noted. Range of motion within normal limits bilateral. Muscle strength 5/5 in all groups bilateral.  Assessment: 1. Onychodystrophic nails 1-5 bilateral with hyperkeratosis of nails.  2. Onychomycosis of nail due to dermatophyte bilateral 3. Pre-ulcerative callus lesions to the bilateral great toes   Plan of Care:  #1 Patient evaluated. #2 Excisional debridement of keratoic lesion using a chisel blade was performed without incident.  #3 Dressed with light dressing. #4 Mechanical debridement of nails 1-5 bilaterally performed using a nail nipper. Filed with dremel without  incident.  #5 Patient is to return to the clinic in 3 months.   Edrick Kins, DPM [facility]  Dr. Edrick Kins, [place]                                        [place], [place] [address]                Office [phone]  Fax [phone]

## 2018-02-22 DIAGNOSIS — M1990 Unspecified osteoarthritis, unspecified site: Secondary | ICD-10-CM | POA: Diagnosis not present

## 2018-02-22 DIAGNOSIS — M818 Other osteoporosis without current pathological fracture: Secondary | ICD-10-CM | POA: Diagnosis not present

## 2018-02-22 DIAGNOSIS — L989 Disorder of the skin and subcutaneous tissue, unspecified: Secondary | ICD-10-CM | POA: Diagnosis not present

## 2018-02-22 DIAGNOSIS — I340 Nonrheumatic mitral (valve) insufficiency: Secondary | ICD-10-CM | POA: Diagnosis not present

## 2018-02-22 DIAGNOSIS — E785 Hyperlipidemia, unspecified: Secondary | ICD-10-CM | POA: Diagnosis not present

## 2018-02-22 DIAGNOSIS — I5032 Chronic diastolic (congestive) heart failure: Secondary | ICD-10-CM | POA: Diagnosis not present

## 2018-02-22 DIAGNOSIS — I10 Essential (primary) hypertension: Secondary | ICD-10-CM | POA: Diagnosis not present

## 2018-02-22 DIAGNOSIS — L209 Atopic dermatitis, unspecified: Secondary | ICD-10-CM | POA: Diagnosis not present

## 2018-02-22 DIAGNOSIS — Z1389 Encounter for screening for other disorder: Secondary | ICD-10-CM | POA: Diagnosis not present

## 2018-02-22 DIAGNOSIS — Z0000 Encounter for general adult medical examination without abnormal findings: Secondary | ICD-10-CM | POA: Diagnosis not present

## 2018-03-06 DIAGNOSIS — C44319 Basal cell carcinoma of skin of other parts of face: Secondary | ICD-10-CM | POA: Diagnosis not present

## 2018-04-05 DIAGNOSIS — Z08 Encounter for follow-up examination after completed treatment for malignant neoplasm: Secondary | ICD-10-CM | POA: Diagnosis not present

## 2018-04-05 DIAGNOSIS — C44319 Basal cell carcinoma of skin of other parts of face: Secondary | ICD-10-CM | POA: Diagnosis not present

## 2018-04-05 DIAGNOSIS — Z85828 Personal history of other malignant neoplasm of skin: Secondary | ICD-10-CM | POA: Diagnosis not present

## 2018-04-10 ENCOUNTER — Ambulatory Visit (INDEPENDENT_AMBULATORY_CARE_PROVIDER_SITE_OTHER): Payer: Medicare Other | Admitting: Podiatry

## 2018-04-10 ENCOUNTER — Encounter: Payer: Self-pay | Admitting: Podiatry

## 2018-04-10 DIAGNOSIS — B351 Tinea unguium: Secondary | ICD-10-CM

## 2018-04-10 DIAGNOSIS — L989 Disorder of the skin and subcutaneous tissue, unspecified: Secondary | ICD-10-CM

## 2018-04-10 DIAGNOSIS — M79676 Pain in unspecified toe(s): Secondary | ICD-10-CM | POA: Diagnosis not present

## 2018-04-12 NOTE — Progress Notes (Signed)
    Subjective: Patient is a 84 y.o. female presenting to the office today with a chief complaint of painful callus lesions to the bilateral feet that have been present for several months. Bearing weight increases the pain.   Patient also complains of elongated, thickened nails that cause pain while ambulating in shoes. She is unable to trim her own nails. Patient presents today for further treatment and evaluation.  Past Medical History:  Diagnosis Date  . Chronic diastolic heart failure ([place])   . Heart murmur   . Hypertension   . Osteoporosis     Objective:  Physical Exam General: Alert and oriented x3 in no acute distress  Dermatology: Hyperkeratotic lesions present on the bilateral feet x 3. Pain on palpation with a central nucleated core noted. Skin is warm, dry and supple bilateral lower extremities. Negative for open lesions or macerations. Nails are tender, long, thickened and dystrophic with subungual debris, consistent with onychomycosis, 1-5 bilateral. No signs of infection noted.  Vascular: Palpable pedal pulses bilaterally. No edema or erythema noted. Capillary refill within normal limits.  Neurological: Epicritic and protective threshold grossly intact bilaterally.   Musculoskeletal Exam: Pain on palpation at the keratotic lesion noted. Range of motion within normal limits bilateral. Muscle strength 5/5 in all groups bilateral.  Assessment: 1. Onychodystrophic nails 1-5 bilateral with hyperkeratosis of nails.  2. Onychomycosis of nail due to dermatophyte bilateral 3. Pre-ulcerative callus lesions x 3 noted to the bilateral feet    Plan of Care:  1. Patient evaluated. 2. Excisional debridement of keratoic lesion using a chisel blade was performed without incident.  3. Dressed with light dressing. 4. Mechanical debridement of nails 1-5 bilaterally performed using a nail nipper. Filed with dremel without incident.  5. Patient is to return to the clinic in 3 months.    Edrick Kins, DPM [facility]  Dr. Edrick Kins, [address]                                        [place], [place] [address]                Office [phone]  Fax [phone]

## 2018-05-03 DIAGNOSIS — C4441 Basal cell carcinoma of skin of scalp and neck: Secondary | ICD-10-CM | POA: Diagnosis not present

## 2018-05-03 DIAGNOSIS — Z08 Encounter for follow-up examination after completed treatment for malignant neoplasm: Secondary | ICD-10-CM | POA: Diagnosis not present

## 2018-05-03 DIAGNOSIS — Z85828 Personal history of other malignant neoplasm of skin: Secondary | ICD-10-CM | POA: Diagnosis not present

## 2018-07-12 ENCOUNTER — Ambulatory Visit (INDEPENDENT_AMBULATORY_CARE_PROVIDER_SITE_OTHER): Payer: Medicare Other | Admitting: Podiatry

## 2018-07-12 DIAGNOSIS — L989 Disorder of the skin and subcutaneous tissue, unspecified: Secondary | ICD-10-CM

## 2018-07-12 DIAGNOSIS — M79676 Pain in unspecified toe(s): Secondary | ICD-10-CM | POA: Diagnosis not present

## 2018-07-12 DIAGNOSIS — B351 Tinea unguium: Secondary | ICD-10-CM | POA: Diagnosis not present

## 2018-07-16 NOTE — Progress Notes (Signed)
    Subjective: Patient is a 84 y.o. female presenting to the office today with a chief complaint of painful callus lesions to the bilateral feet that have been present for several months. Bearing weight increases the pain. She has not done anything at home for treatment.  Patient also complains of elongated, thickened nails that cause pain while ambulating in shoes. She is unable to trim her own nails. Patient presents today for further treatment and evaluation.  Past Medical History:  Diagnosis Date  . Chronic diastolic heart failure ([place])   . Heart murmur   . Hypertension   . Osteoporosis     Objective:  Physical Exam General: Alert and oriented x3 in no acute distress  Dermatology: Hyperkeratotic lesions present on the bilateral feet x 3. Pain on palpation with a central nucleated core noted. Skin is warm, dry and supple bilateral lower extremities. Negative for open lesions or macerations. Nails are tender, long, thickened and dystrophic with subungual debris, consistent with onychomycosis, 1-5 bilateral. No signs of infection noted.  Vascular: Palpable pedal pulses bilaterally. No edema or erythema noted. Capillary refill within normal limits.  Neurological: Epicritic and protective threshold grossly intact bilaterally.   Musculoskeletal Exam: Pain on palpation at the keratotic lesion noted. Range of motion within normal limits bilateral. Muscle strength 5/5 in all groups bilateral.  Assessment: 1. Onychodystrophic nails 1-5 bilateral with hyperkeratosis of nails.  2. Onychomycosis of nail due to dermatophyte bilateral 3. Pre-ulcerative callus lesions x 3 noted to the bilateral feet    Plan of Care:  1. Patient evaluated. 2. Excisional debridement of keratoic lesion using a chisel blade was performed without incident.  3. Dressed with light dressing. 4. Mechanical debridement of nails 1-5 bilaterally performed using a nail nipper. Filed with dremel without incident.  5.  Patient is to return to the clinic in 3 months.   Edrick Kins, DPM [facility]  Dr. Edrick Kins, [address]                                        [place], [place] [address]                Office [phone]  Fax [phone]

## 2018-08-29 DIAGNOSIS — I5032 Chronic diastolic (congestive) heart failure: Secondary | ICD-10-CM | POA: Diagnosis not present

## 2018-08-29 DIAGNOSIS — I10 Essential (primary) hypertension: Secondary | ICD-10-CM | POA: Diagnosis not present

## 2018-08-29 DIAGNOSIS — E785 Hyperlipidemia, unspecified: Secondary | ICD-10-CM | POA: Diagnosis not present

## 2018-08-29 DIAGNOSIS — M1990 Unspecified osteoarthritis, unspecified site: Secondary | ICD-10-CM | POA: Diagnosis not present

## 2018-10-11 ENCOUNTER — Encounter: Payer: Self-pay | Admitting: Podiatry

## 2018-10-11 ENCOUNTER — Ambulatory Visit (INDEPENDENT_AMBULATORY_CARE_PROVIDER_SITE_OTHER): Payer: Medicare Other | Admitting: Podiatry

## 2018-10-11 DIAGNOSIS — L84 Corns and callosities: Secondary | ICD-10-CM | POA: Diagnosis not present

## 2018-10-11 DIAGNOSIS — B351 Tinea unguium: Secondary | ICD-10-CM | POA: Diagnosis not present

## 2018-10-11 DIAGNOSIS — M79674 Pain in right toe(s): Secondary | ICD-10-CM

## 2018-10-11 DIAGNOSIS — M79675 Pain in left toe(s): Secondary | ICD-10-CM

## 2018-10-11 MED ORDER — CICLOPIROX 8 % EX SOLN: Freq: Every day | CUTANEOUS | 3 refills | Status: DC

## 2018-10-11 NOTE — Patient Instructions (Signed)

## 2018-10-28 NOTE — Progress Notes (Signed)
Subjective: Andrea [name] presents to clinic today today for painful, discolored, thick toenails which interfere with daily activities.  Pain is aggravated when wearing enclosed shoe gear and relieved with periodic professional debridement.  Objective: Vascular Examination: Capillary refill time immediate x10 digits  Dorsalis pedis palpable bilaterally Posterior tibial pulses palpable bilaterally Digital hair x 10 digits sparse Skin temperature gradient within normal limits bilaterally  Dermatological Examination: Skin with normal turgor texture and tone bilaterally Toenails 1-5 b/l discolored, thick, dystrophic with subungual debris and pain with palpation to nailbeds due to thickness of nails. Hyperkeratotic lesions noted distal tip bilateral third digits and plantar medial hallux of the right foot with tenderness to palpation.  No erythema, no edema, no flocculence noted.  Musculoskeletal: Muscle strength 5/5 to all LE muscle groups Range of motion noted all joints bilaterally with no crepitus and no effusions  Neurological: Sensation intact with 10 gram monofilament. Vibratory sensation intact.  Assessment: Painful onychomycosis toenails 1-5 b/l  Painful hyperkeratotic lesions noted x3   Plan: 1. Toenails 1-5 b/l were debrided in length and girth without iatrogenic bleeding. 2. Hyperkeratotic lesions hair but incident x3 distal tip bilateral third digits and right hallux 3. Patient to continue soft, supportive shoe gear 4. Patient to report any pedal injuries to medical professional immediately. 5. Follow up 3 months. Patient/POA to call should there be a concern in the interim.

## 2019-01-11 ENCOUNTER — Ambulatory Visit: Payer: Medicare Other | Admitting: Podiatry

## 2019-02-06 ENCOUNTER — Ambulatory Visit (INDEPENDENT_AMBULATORY_CARE_PROVIDER_SITE_OTHER): Payer: Medicare Other | Admitting: Podiatry

## 2019-02-06 DIAGNOSIS — L84 Corns and callosities: Secondary | ICD-10-CM

## 2019-02-06 DIAGNOSIS — B351 Tinea unguium: Secondary | ICD-10-CM | POA: Diagnosis not present

## 2019-02-06 DIAGNOSIS — M79674 Pain in right toe(s): Secondary | ICD-10-CM | POA: Diagnosis not present

## 2019-02-06 DIAGNOSIS — M79675 Pain in left toe(s): Secondary | ICD-10-CM | POA: Diagnosis not present

## 2019-02-06 NOTE — Patient Instructions (Signed)
Onychomycosis/Fungal Toenails  WHAT IS IT? An infection that lies within the keratin of your nail plate that is caused by a fungus.  WHY ME? Fungal infections affect all ages, sexes, races, and creeds.  There may be many factors that predispose you to a fungal infection such as age, coexisting medical conditions such as diabetes, or an autoimmune disease; stress, medications, fatigue, genetics, etc.  Bottom line: fungus thrives in a warm, moist environment and your shoes offer such a location.  IS IT CONTAGIOUS? Theoretically, yes.  You do not want to share shoes, nail clippers or files with someone who has fungal toenails.  Walking around barefoot in the same room or sleeping in the same bed is unlikely to transfer the organism.  It is important to realize, however, that fungus can spread easily from one nail to the next on the same foot.  HOW DO WE TREAT THIS?  There are several ways to treat this condition.  Treatment may depend on many factors such as age, medications, pregnancy, liver and kidney conditions, etc.  It is best to ask your doctor which options are available to you.  1. No treatment.   Unlike many other medical concerns, you can live with this condition.  However for many people this can be a painful condition and may lead to ingrown toenails or a bacterial infection.  It is recommended that you keep the nails cut short to help reduce the amount of fungal nail. 2. Topical treatment.  These range from herbal remedies to prescription strength nail lacquers.  About 40-50% effective, topicals require twice daily application for approximately 9 to 12 months or until an entirely new nail has grown out.  The most effective topicals are medical grade medications available through physicians offices. 3. Oral antifungal medications.  With an 80-90% cure rate, the most common oral medication requires 3 to 4 months of therapy and stays in your system for a year as the new nail grows out.  Oral  antifungal medications do require blood work to make sure it is a safe drug for you.  A liver function panel will be performed prior to starting the medication and after the first month of treatment.  It is important to have the blood work performed to avoid any harmful side effects.  In general, this medication safe but blood work is required. 4. Laser Therapy.  This treatment is performed by applying a specialized laser to the affected nail plate.  This therapy is noninvasive, fast, and non-painful.  It is not covered by insurance and is therefore, out of pocket.  The results have been very good with a 80-95% cure rate.  The [facility] is the only practice in the area to offer this therapy. 5. Permanent Nail Avulsion.  Removing the entire nail so that a new nail will not grow back.  Corns and Calluses Corns are small areas of thickened skin that occur on the top, sides, or tip of a toe. They contain a cone-shaped core with a point that can press on a nerve below. This causes pain.  Calluses are areas of thickened skin that can occur anywhere on the body, including the hands, fingers, palms, soles of the feet, and heels. Calluses are usually larger than corns. What are the causes? Corns and calluses are caused by rubbing (friction) or pressure, such as from shoes that are too tight or do not fit properly. What increases the risk? Corns are more likely to develop in people   who have misshapen toes (toe deformities), such as hammer toes. Calluses can occur with friction to any area of the skin. They are more likely to develop in people who:  Work with their hands.  Wear shoes that fit poorly, are too tight, or are high-heeled.  Have toe deformities. What are the signs or symptoms? Symptoms of a corn or callus include:  A hard growth on the skin.  Pain or tenderness under the skin.  Redness and swelling.  Increased discomfort while wearing tight-fitting shoes, if your feet are  affected. If a corn or callus becomes infected, symptoms may include:  Redness and swelling that gets worse.  Pain.  Fluid, blood, or pus draining from the corn or callus. How is this diagnosed? Corns and calluses may be diagnosed based on your symptoms, your medical history, and a physical exam. How is this treated? Treatment for corns and calluses may include:  Removing the cause of the friction or pressure. This may involve: ? Changing your shoes. ? Wearing shoe inserts (orthotics) or other protective layers in your shoes, such as a corn pad. ? Wearing gloves.  Applying medicine to the skin (topical medicine) to help soften skin in the hardened, thickened areas.  Removing layers of dead skin with a file to reduce the size of the corn or callus.  Removing the corn or callus with a scalpel or laser.  Taking antibiotic medicines, if your corn or callus is infected.  Having surgery, if a toe deformity is the cause. Follow these instructions at home:   Take over-the-counter and prescription medicines only as told by your health care provider.  If you were prescribed an antibiotic, take it as told by your health care provider. Do not stop taking it even if your condition starts to improve.  Wear shoes that fit well. Avoid wearing high-heeled shoes and shoes that are too tight or too loose.  Wear any padding, protective layers, gloves, or orthotics as told by your health care provider.  Soak your hands or feet and then use a file or pumice stone to soften your corn or callus. Do this as told by your health care provider.  Check your corn or callus every day for symptoms of infection. Contact a health care provider if you:  Notice that your symptoms do not improve with treatment.  Have redness or swelling that gets worse.  Notice that your corn or callus becomes painful.  Have fluid, blood, or pus coming from your corn or callus.  Have new symptoms. Summary  Corns are  small areas of thickened skin that occur on the top, sides, or tip of a toe.  Calluses are areas of thickened skin that can occur anywhere on the body, including the hands, fingers, palms, and soles of the feet. Calluses are usually larger than corns.  Corns and calluses are caused by rubbing (friction) or pressure, such as from shoes that are too tight or do not fit properly.  Treatment may include wearing any padding, protective layers, gloves, or orthotics as told by your health care provider. This information is not intended to replace advice given to you by your health care provider. Make sure you discuss any questions you have with your health care provider. Document Released: 08/28/2004 Document Revised: 10/05/2017 Document Reviewed: 10/05/2017 Elsevier Interactive Patient Education  2019 [employer]  

## 2019-02-12 ENCOUNTER — Encounter: Payer: Self-pay | Admitting: Podiatry

## 2019-02-12 NOTE — Progress Notes (Signed)
Subjective: Andrea [name] presents accompanied by her son on today for follow up of painful corns b/l. Patient also has painful, thick toenails 1-5 b/l that she cannot cut and which interfere with daily activities.  Pain is aggravated when weightbearing with and without wearing enclosed shoe gear.  Pt's son states he would not like corns pared today because his Mom complained of pain after treatment everyday for the past 2 months. They denied any drainage, redness or swelling of digits.   Mom lives alone but Son lives very close to her and can get to her in a minute.   Leeroy Cha, MD is her PCP.    Current Outpatient Medications:  .  Acetaminophen (TYLENOL PO), Take by mouth., Disp: , Rfl:  .  aspirin EC 81 MG EC tablet, Take 1 tablet (81 mg total) by mouth daily., Disp: , Rfl:  .  Calcium Carbonate-Vit D-Min (CALCIUM 1200 PO), Take 1,200 mg by mouth daily., Disp: , Rfl:  .  calcium-vitamin D (OSCAL WITH D) 500-200 MG-UNIT tablet, Take by mouth., Disp: , Rfl:  .  cholecalciferol (VITAMIN D) 1000 UNITS tablet, Take 1,000 Units by mouth daily., Disp: , Rfl:  .  furosemide (LASIX) 20 MG tablet, Take 1 tablet (20 mg total) by mouth daily., Disp: 30 tablet, Rfl: 0 .  Multiple Vitamin (MULTIVITAMIN WITH MINERALS) TABS, Take 1 tablet by mouth daily., Disp: , Rfl:  .  potassium chloride (K-DUR) 10 MEQ tablet, Take 2 tablets (20 mEq total) by mouth daily., Disp: 30 tablet, Rfl: 0 .  telmisartan (MICARDIS) 80 MG tablet, Take 1 tablet (80 mg total) by mouth daily., Disp: 90 tablet, Rfl: 3 .  triamcinolone cream (KENALOG) 0.1 %, APPLY 1 APPLICATION TO AFFECTED AREA TWICE A DAY EXTERNALLY 1 WEEK, Disp: , Rfl: 1  No Known Allergies  Objective:  Vascular Examination: Capillary refill time immediate x 10 digits.  Dorsalis pedis and Posterior tibial pulses palpable b/l  Digital hair sparse x 10 digits  Skin temperature gradient WNL b/l  Dermatological Examination: Skin with normal  turgor, texture and tone b/l  Toenails 1-5 b/l discolored, thick, dystrophic with subungual debris and pain with palpation to nailbeds due to thickness of nails.  Preulcerative corns noted distal tip b/l 3rd digits with subdermal hemorrhage visible. No erythema, no edema, no flocculence noted with either digit. +Tenderness to palpation.  Hyperkeratotic lesion right hallux. No erythema, no edema, no drainage, no flocculence.  Musculoskeletal: Muscle strength 5/5 to all LE muscle groups  Hammertoe deformity 2-5 b/l.  No pain, crepitus or joint limitation noted with ROM.   Neurological: Sensation intact with 10 gram monofilament. Vibratory sensation intact.  Assessment: Painful onychomycosis toenails 1-5 b/l  Preulcerative corns b/l 3rd digits Callus right hallux  Plan: 1. Toenails 1-5 b/l were debrided in length and girth without iatrogenic bleeding. 2. Discussed consequence of not having preulcerative lesions pared being development of wounds. Pt/son agreed to minimal debridement with burr. Lesions gently burred b/l 3rd digits. Right hallux pared with sterile scalpel blade without incident. 3. Patient to continue soft, supportive shoe gear. Recommended change in shoe gear to [employer] with memory foam insoles. Son will purchase. 4. Patient to report any pedal injuries to medical professional immediately. 5. Follow up 3 months.  6. Patient/POA to call should there be a concern in the interim.

## 2019-03-02 DIAGNOSIS — I5032 Chronic diastolic (congestive) heart failure: Secondary | ICD-10-CM | POA: Diagnosis not present

## 2019-03-02 DIAGNOSIS — I340 Nonrheumatic mitral (valve) insufficiency: Secondary | ICD-10-CM | POA: Diagnosis not present

## 2019-03-02 DIAGNOSIS — R233 Spontaneous ecchymoses: Secondary | ICD-10-CM | POA: Diagnosis not present

## 2019-04-10 ENCOUNTER — Ambulatory Visit: Payer: Medicare Other | Admitting: Podiatry

## 2019-06-20 DIAGNOSIS — I340 Nonrheumatic mitral (valve) insufficiency: Secondary | ICD-10-CM | POA: Diagnosis not present

## 2019-06-20 DIAGNOSIS — I5032 Chronic diastolic (congestive) heart failure: Secondary | ICD-10-CM | POA: Diagnosis not present

## 2019-06-20 DIAGNOSIS — M1990 Unspecified osteoarthritis, unspecified site: Secondary | ICD-10-CM | POA: Diagnosis not present

## 2019-06-20 DIAGNOSIS — Z0000 Encounter for general adult medical examination without abnormal findings: Secondary | ICD-10-CM | POA: Diagnosis not present

## 2019-06-20 DIAGNOSIS — I10 Essential (primary) hypertension: Secondary | ICD-10-CM | POA: Diagnosis not present

## 2019-06-20 DIAGNOSIS — E785 Hyperlipidemia, unspecified: Secondary | ICD-10-CM | POA: Diagnosis not present

## 2019-07-03 ENCOUNTER — Other Ambulatory Visit: Payer: Self-pay | Admitting: Internal Medicine

## 2019-07-03 DIAGNOSIS — M810 Age-related osteoporosis without current pathological fracture: Secondary | ICD-10-CM

## 2019-07-04 DIAGNOSIS — E785 Hyperlipidemia, unspecified: Secondary | ICD-10-CM | POA: Diagnosis not present

## 2019-07-04 DIAGNOSIS — I10 Essential (primary) hypertension: Secondary | ICD-10-CM | POA: Diagnosis not present

## 2019-09-11 ENCOUNTER — Other Ambulatory Visit: Payer: Medicare Other

## 2019-11-28 ENCOUNTER — Other Ambulatory Visit: Payer: Medicare Other

## 2020-01-02 DIAGNOSIS — I5032 Chronic diastolic (congestive) heart failure: Secondary | ICD-10-CM | POA: Diagnosis not present

## 2020-01-02 DIAGNOSIS — I10 Essential (primary) hypertension: Secondary | ICD-10-CM | POA: Diagnosis not present

## 2020-01-07 DIAGNOSIS — I10 Essential (primary) hypertension: Secondary | ICD-10-CM | POA: Diagnosis not present

## 2020-02-28 ENCOUNTER — Telehealth: Payer: Self-pay | Admitting: Cardiology

## 2020-02-28 ENCOUNTER — Ambulatory Visit (INDEPENDENT_AMBULATORY_CARE_PROVIDER_SITE_OTHER): Payer: Medicare Other | Admitting: Cardiology

## 2020-02-28 ENCOUNTER — Other Ambulatory Visit: Payer: Self-pay

## 2020-02-28 ENCOUNTER — Encounter: Payer: Self-pay | Admitting: Cardiology

## 2020-02-28 VITALS — BP 148/82 | HR 69 | Resp 15 | Ht <= 58 in | Wt 116.0 lb

## 2020-02-28 DIAGNOSIS — I10 Essential (primary) hypertension: Secondary | ICD-10-CM

## 2020-02-28 DIAGNOSIS — I340 Nonrheumatic mitral (valve) insufficiency: Secondary | ICD-10-CM | POA: Diagnosis not present

## 2020-02-28 DIAGNOSIS — I444 Left anterior fascicular block: Secondary | ICD-10-CM | POA: Diagnosis not present

## 2020-02-28 DIAGNOSIS — I5032 Chronic diastolic (congestive) heart failure: Secondary | ICD-10-CM

## 2020-02-28 NOTE — Patient Instructions (Signed)
Medication Instructions:  The current medical regimen is effective;  continue present plan and medications.  *If you need a refill on your cardiac medications before your next appointment, please call your pharmacy*  Follow-Up: At [facility], you and your health needs are our priority.  As part of our continuing mission to provide you with exceptional heart care, we have created designated Provider Care Teams.  These Care Teams include your primary Cardiologist (physician) and Advanced Practice Providers (APPs -  Physician Assistants and Nurse Practitioners) who all work together to provide you with the care you need, when you need it.  We recommend signing up for the patient portal called "MyChart".  Sign up information is provided on this After Visit Summary.  MyChart is used to connect with patients for Virtual Visits (Telemedicine).  Patients are able to view lab/test results, encounter notes, upcoming appointments, etc.  Non-urgent messages can be sent to your provider as well.   To learn more about what you can do with MyChart, go to [email or web address].    Your next appointment:   12 month(s)  The format for your next appointment:   In Person  Provider:   Mark Skains, MD   Thank you for choosing [facility]!!      

## 2020-02-28 NOTE — Telephone Encounter (Signed)
Patient son called wanting to know if he can come with his mother to her appt.  Patient is in a wheelchair and is hard of hearing.  He is her caretaker.

## 2020-02-28 NOTE — Telephone Encounter (Signed)
spoke with son Pilar Plate - aware OK to come up with patient for her appt.  Aware he will be screened and will need to wear a mask.  Note placed in appt notes.

## 2020-02-28 NOTE — Addendum Note (Signed)
Addended by: Jerline Pain on: 02/28/2020 02:54 PM   Modules accepted: Level of Service

## 2020-02-28 NOTE — Progress Notes (Signed)
Cardiology Office Note:    Date:  02/28/2020   ID:  Andrea [name], DOB 05/23/1920, MRN [id number]  PCP:  Leeroy Cha, MD  Cardiologist:  No primary care provider on file.  Electrophysiologist:  None   Referring MD: Leeroy Cha,*     History of Present Illness:    Andrea [name] is a 86 y.o. female here for the evaluation of severe mitral regurgitation at the request of Dr. Juanda Crumble.  And over 3 years since her last visit.  Echocardiogram from 09/22/2012 demonstrated the following:  - Left ventricle: The cavity size was normal. Wall thickness  was normal. Systolic function was normal. The estimated  ejection fraction was in the range of 55% to 60%. Wall  motion was normal; there were no regional wall motion  abnormalities. Features are consistent with a pseudonormal  left ventricular filling pattern, with concomitant  abnormal relaxation and increased filling pressure (grade  2 diastolic dysfunction).  - Mitral valve: Calcified annulus. Mildly thickened leaflets  . Prolapse, involving the posterior leaflet. Severe  regurgitation.  - Left atrium: The atrium was mildly dilated.  - Tricuspid valve: Moderate regurgitation.  - Pulmonary arteries: Systolic pressure was mildly to  moderately increased.  - Pericardium, extracardiac: There was a left pleural  effusion.  Impressions:   - Partially flail posterior MV leaflet with severe MR;  mobile density associated with MV (probable ruptured  chordae; cannot exclude vegetation); suggest TEE to furher  evaluate if clinically indicated.   3 years ago at that time she was having mild dyspnea on exertion when bending over no real changes.  Pretty much the same amount of energy.  Sometimes when she lays flat on her back she may feel better.  Has been taking Lasix 20 mg a day and stable.  At prior visit we did discuss DO NOT RESUSCITATE  Previously had hip dislocation with no  surgery.  Utilizes a cane.  Previously we had talked about the dangers of going down to the basement using the stairs.  This could be dangerous.  Her son present.  She herself states that sometimes she is lazy but this is because she has arthritis and does not want to move too much.  Seems to be okay.   Past Medical History:  Diagnosis Date  . Chronic diastolic heart failure ([place])   . Heart murmur   . Hypertension   . Osteoporosis     Past Surgical History:  Procedure Laterality Date  . CATARACT EXTRACTION      Current Medications: Current Meds  Medication Sig  . Acetaminophen (TYLENOL PO) Take 500 mg by mouth 3 (three) times daily as needed (pain).   . Calcium Carbonate-Vit D-Min (CALCIUM 1200 PO) Take 1,200 mg by mouth daily.  . furosemide (LASIX) 40 MG tablet Take 40 mg by mouth daily.  . Multiple Vitamin (MULTIVITAMIN WITH MINERALS) TABS Take 1 tablet by mouth daily.  . potassium chloride (K-DUR) 10 MEQ tablet Take 2 tablets (20 mEq total) by mouth daily.  Marland Kitchen telmisartan (MICARDIS) 80 MG tablet Take 1 tablet (80 mg total) by mouth daily.     Allergies:   Patient has no known allergies.   Social History   Socioeconomic History  . Marital status: Widowed    Spouse name: Not on file  . Number of children: Not on file  . Years of education: Not on file  . Highest education level: Not on file  Occupational History  . Not on file  Tobacco Use  .  Smoking status: Never Smoker  . Smokeless tobacco: Never Used  Substance and Sexual Activity  . Alcohol use: No  . Drug use: No  . Sexual activity: Never  Other Topics Concern  . Not on file  Social History Narrative  . Not on file   Social Determinants of Health   Financial Resource Strain:   . Difficulty of Paying Living Expenses:   Food Insecurity:   . Worried About Charity fundraiser in the Last Year:   . Arboriculturist in the Last Year:   Transportation Needs:   . Film/video editor (Medical):   Marland Kitchen Lack of  Transportation (Non-Medical):   Physical Activity:   . Days of Exercise per Week:   . Minutes of Exercise per Session:   Stress:   . Feeling of Stress :   Social Connections:   . Frequency of Communication with Friends and Family:   . Frequency of Social Gatherings with Friends and Family:   . Attends Religious Services:   . Active Member of Clubs or Organizations:   . Attends Archivist Meetings:   Marland Kitchen Marital Status:      Family History: The patient's family history includes Dementia in her mother; Lung cancer in her father.  ROS:   Please see the history of present illness.    No syncope no bleeding.  Positive knee arthritis.  All other systems reviewed and are negative.  EKGs/Labs/Other Studies Reviewed:    The following studies were reviewed today: Echocardiogram reviewed as above.  EKG:  EKG is  ordered today.  The ekg ordered today demonstrates sinus rhythm LAFB PVCs 69 bpm  Recent Labs: No results found for requested labs within last 8760 hours.  Recent Lipid Panel No results found for: CHOL, TRIG, HDL, CHOLHDL, VLDL, LDLCALC, LDLDIRECT  Physical Exam:    VS:  BP (!) 148/82   Pulse 69   Resp 15   Ht 4\' 10"  (1.473 m)   Wt 116 lb (52.6 kg)   LMP  (LMP Unknown)   SpO2 97%   BMI 24.24 kg/m     Wt Readings from Last 3 Encounters:  02/28/20 116 lb (52.6 kg)  01/25/17 108 lb 12.8 oz (49.4 kg)  07/26/16 104 lb (47.2 kg)     GEN: Elderly, in wheelchair pleasant well nourished, well developed in no acute distress HEENT: Normal hard of hearing NECK: No JVD; No carotid bruits LYMPHATICS: No lymphadenopathy CARDIAC: RRR, 2/6 holosystolic murmur, no rubs, gallops RESPIRATORY:  Clear to auscultation without rales, wheezing or rhonchi  ABDOMEN: Soft, non-tender, non-distended MUSCULOSKELETAL:  No edema; No deformity  SKIN: Warm and dry NEUROLOGIC:  Alert and oriented x 3 PSYCHIATRIC:  Normal affect   ASSESSMENT:    1. Severe mitral regurgitation   2.  Essential hypertension   3. Left anterior fascicular block   4. Chronic diastolic heart failure (HCC)    PLAN:    In order of problems listed above:  Severe mitral regurgitation -Previously we discussed not pursuing surgical intervention.  Seems stable.  Decrease stamina.  I do not think she would be a candidate for MitraClip as well.  Overall she has been amazingly stable.  No changes.  Continue with Lasix.  Essential hypertension -No changes made.  Medications reviewed.  Telmisartan.  Chronic diastolic heart failure -Seems to be fairly well compensated.  Taking low-dose Lasix.  Following lab work.  Left anterior fascicular block -No syncope.   Medication Adjustments/Labs and Tests Ordered:  Current medicines are reviewed at length with the patient today.  Concerns regarding medicines are outlined above.  Orders Placed This Encounter  Procedures  . EKG 12-Lead   No orders of the defined types were placed in this encounter.   Patient Instructions  Medication Instructions:  The current medical regimen is effective;  continue present plan and medications.  *If you need a refill on your cardiac medications before your next appointment, please call your pharmacy*  Follow-Up: At [facility], you and your health needs are our priority.  As part of our continuing mission to provide you with exceptional heart care, we have created designated Provider Care Teams.  These Care Teams include your primary Cardiologist (physician) and Advanced Practice Providers (APPs -  Physician Assistants and Nurse Practitioners) who all work together to provide you with the care you need, when you need it.  We recommend signing up for the patient portal called "MyChart".  Sign up information is provided on this After Visit Summary.  MyChart is used to connect with patients for Virtual Visits (Telemedicine).  Patients are able to view lab/test results, encounter notes, upcoming appointments, etc.   Non-urgent messages can be sent to your provider as well.   To learn more about what you can do with MyChart, go to [email or web address].    Your next appointment:   12 month(s)  The format for your next appointment:   In Person  Provider:   Candee Furbish, MD  Thank you for choosing [facility]!!       Signed, Candee Furbish, MD  02/28/2020 2:54 PM    [place]

## 2020-07-02 DIAGNOSIS — I340 Nonrheumatic mitral (valve) insufficiency: Secondary | ICD-10-CM | POA: Diagnosis not present

## 2020-07-02 DIAGNOSIS — E785 Hyperlipidemia, unspecified: Secondary | ICD-10-CM | POA: Diagnosis not present

## 2020-07-02 DIAGNOSIS — Z7189 Other specified counseling: Secondary | ICD-10-CM | POA: Diagnosis not present

## 2020-07-02 DIAGNOSIS — M818 Other osteoporosis without current pathological fracture: Secondary | ICD-10-CM | POA: Diagnosis not present

## 2020-07-02 DIAGNOSIS — I5032 Chronic diastolic (congestive) heart failure: Secondary | ICD-10-CM | POA: Diagnosis not present

## 2020-07-02 DIAGNOSIS — Z1389 Encounter for screening for other disorder: Secondary | ICD-10-CM | POA: Diagnosis not present

## 2020-07-02 DIAGNOSIS — Z0000 Encounter for general adult medical examination without abnormal findings: Secondary | ICD-10-CM | POA: Diagnosis not present

## 2020-07-02 DIAGNOSIS — I10 Essential (primary) hypertension: Secondary | ICD-10-CM | POA: Diagnosis not present

## 2021-07-03 DIAGNOSIS — I5032 Chronic diastolic (congestive) heart failure: Secondary | ICD-10-CM | POA: Diagnosis not present

## 2021-07-03 DIAGNOSIS — M818 Other osteoporosis without current pathological fracture: Secondary | ICD-10-CM | POA: Diagnosis not present

## 2021-07-03 DIAGNOSIS — L209 Atopic dermatitis, unspecified: Secondary | ICD-10-CM | POA: Diagnosis not present

## 2021-07-03 DIAGNOSIS — E785 Hyperlipidemia, unspecified: Secondary | ICD-10-CM | POA: Diagnosis not present

## 2021-07-03 DIAGNOSIS — M1990 Unspecified osteoarthritis, unspecified site: Secondary | ICD-10-CM | POA: Diagnosis not present

## 2021-07-03 DIAGNOSIS — I340 Nonrheumatic mitral (valve) insufficiency: Secondary | ICD-10-CM | POA: Diagnosis not present

## 2021-07-03 DIAGNOSIS — I10 Essential (primary) hypertension: Secondary | ICD-10-CM | POA: Diagnosis not present

## 2022-01-01 DIAGNOSIS — I5032 Chronic diastolic (congestive) heart failure: Secondary | ICD-10-CM | POA: Diagnosis not present

## 2022-01-01 DIAGNOSIS — I340 Nonrheumatic mitral (valve) insufficiency: Secondary | ICD-10-CM | POA: Diagnosis not present

## 2022-01-01 DIAGNOSIS — I10 Essential (primary) hypertension: Secondary | ICD-10-CM | POA: Diagnosis not present

## 2022-07-12 DIAGNOSIS — I5032 Chronic diastolic (congestive) heart failure: Secondary | ICD-10-CM | POA: Diagnosis not present

## 2022-07-12 DIAGNOSIS — I10 Essential (primary) hypertension: Secondary | ICD-10-CM | POA: Diagnosis not present

## 2022-07-12 DIAGNOSIS — Z1331 Encounter for screening for depression: Secondary | ICD-10-CM | POA: Diagnosis not present

## 2022-07-12 DIAGNOSIS — Z23 Encounter for immunization: Secondary | ICD-10-CM | POA: Diagnosis not present

## 2022-07-12 DIAGNOSIS — E785 Hyperlipidemia, unspecified: Secondary | ICD-10-CM | POA: Diagnosis not present

## 2022-07-12 DIAGNOSIS — Z0000 Encounter for general adult medical examination without abnormal findings: Secondary | ICD-10-CM | POA: Diagnosis not present

## 2022-07-12 DIAGNOSIS — M818 Other osteoporosis without current pathological fracture: Secondary | ICD-10-CM | POA: Diagnosis not present

## 2022-12-13 ENCOUNTER — Emergency Department (HOSPITAL_COMMUNITY): Payer: Medicare Other

## 2022-12-13 ENCOUNTER — Other Ambulatory Visit: Payer: Self-pay

## 2022-12-13 ENCOUNTER — Encounter (HOSPITAL_COMMUNITY): Payer: Self-pay

## 2022-12-13 ENCOUNTER — Inpatient Hospital Stay (HOSPITAL_COMMUNITY)
Admission: EM | Admit: 2022-12-13 | Discharge: 2022-12-16 | DRG: 536 | Disposition: A | Discharge status: Skilled Nursing Facility | Payer: Medicare Other | Attending: Internal Medicine | Admitting: Internal Medicine

## 2022-12-13 DIAGNOSIS — Z043 Encounter for examination and observation following other accident: Secondary | ICD-10-CM | POA: Diagnosis not present

## 2022-12-13 DIAGNOSIS — E861 Hypovolemia: Secondary | ICD-10-CM | POA: Diagnosis present

## 2022-12-13 DIAGNOSIS — M24451 Recurrent dislocation, right hip: Secondary | ICD-10-CM | POA: Diagnosis present

## 2022-12-13 DIAGNOSIS — S32591D Other specified fracture of right pubis, subsequent encounter for fracture with routine healing: Secondary | ICD-10-CM | POA: Diagnosis not present

## 2022-12-13 DIAGNOSIS — I110 Hypertensive heart disease with heart failure: Secondary | ICD-10-CM | POA: Diagnosis present

## 2022-12-13 DIAGNOSIS — Y92002 Bathroom of unspecified non-institutional (private) residence single-family (private) house as the place of occurrence of the external cause: Secondary | ICD-10-CM | POA: Diagnosis not present

## 2022-12-13 DIAGNOSIS — M47812 Spondylosis without myelopathy or radiculopathy, cervical region: Secondary | ICD-10-CM | POA: Diagnosis not present

## 2022-12-13 DIAGNOSIS — R2689 Other abnormalities of gait and mobility: Secondary | ICD-10-CM | POA: Diagnosis not present

## 2022-12-13 DIAGNOSIS — F03918 Unspecified dementia, unspecified severity, with other behavioral disturbance: Secondary | ICD-10-CM | POA: Diagnosis present

## 2022-12-13 DIAGNOSIS — Z66 Do not resuscitate: Secondary | ICD-10-CM | POA: Diagnosis present

## 2022-12-13 DIAGNOSIS — M810 Age-related osteoporosis without current pathological fracture: Secondary | ICD-10-CM | POA: Diagnosis present

## 2022-12-13 DIAGNOSIS — W19XXXA Unspecified fall, initial encounter: Secondary | ICD-10-CM | POA: Diagnosis not present

## 2022-12-13 DIAGNOSIS — S3210XD Unspecified fracture of sacrum, subsequent encounter for fracture with routine healing: Secondary | ICD-10-CM | POA: Diagnosis not present

## 2022-12-13 DIAGNOSIS — S32110A Nondisplaced Zone I fracture of sacrum, initial encounter for closed fracture: Secondary | ICD-10-CM | POA: Diagnosis not present

## 2022-12-13 DIAGNOSIS — M25451 Effusion, right hip: Secondary | ICD-10-CM | POA: Diagnosis not present

## 2022-12-13 DIAGNOSIS — I6381 Other cerebral infarction due to occlusion or stenosis of small artery: Secondary | ICD-10-CM | POA: Diagnosis not present

## 2022-12-13 DIAGNOSIS — S329XXA Fracture of unspecified parts of lumbosacral spine and pelvis, initial encounter for closed fracture: Principal | ICD-10-CM

## 2022-12-13 DIAGNOSIS — R4182 Altered mental status, unspecified: Secondary | ICD-10-CM | POA: Diagnosis not present

## 2022-12-13 DIAGNOSIS — D6489 Other specified anemias: Secondary | ICD-10-CM | POA: Diagnosis not present

## 2022-12-13 DIAGNOSIS — D259 Leiomyoma of uterus, unspecified: Secondary | ICD-10-CM | POA: Diagnosis not present

## 2022-12-13 DIAGNOSIS — D649 Anemia, unspecified: Secondary | ICD-10-CM | POA: Diagnosis present

## 2022-12-13 DIAGNOSIS — I340 Nonrheumatic mitral (valve) insufficiency: Secondary | ICD-10-CM | POA: Diagnosis present

## 2022-12-13 DIAGNOSIS — Z79899 Other long term (current) drug therapy: Secondary | ICD-10-CM

## 2022-12-13 DIAGNOSIS — S32591A Other specified fracture of right pubis, initial encounter for closed fracture: Secondary | ICD-10-CM | POA: Diagnosis present

## 2022-12-13 DIAGNOSIS — R011 Cardiac murmur, unspecified: Secondary | ICD-10-CM | POA: Diagnosis not present

## 2022-12-13 DIAGNOSIS — M25551 Pain in right hip: Secondary | ICD-10-CM | POA: Diagnosis not present

## 2022-12-13 DIAGNOSIS — S32501A Unspecified fracture of right pubis, initial encounter for closed fracture: Secondary | ICD-10-CM | POA: Diagnosis not present

## 2022-12-13 DIAGNOSIS — M6281 Muscle weakness (generalized): Secondary | ICD-10-CM | POA: Diagnosis not present

## 2022-12-13 DIAGNOSIS — F03911 Unspecified dementia, unspecified severity, with agitation: Secondary | ICD-10-CM | POA: Diagnosis present

## 2022-12-13 DIAGNOSIS — L03116 Cellulitis of left lower limb: Secondary | ICD-10-CM | POA: Diagnosis not present

## 2022-12-13 DIAGNOSIS — Z23 Encounter for immunization: Secondary | ICD-10-CM | POA: Diagnosis not present

## 2022-12-13 DIAGNOSIS — S3210XA Unspecified fracture of sacrum, initial encounter for closed fracture: Secondary | ICD-10-CM | POA: Diagnosis present

## 2022-12-13 DIAGNOSIS — R1310 Dysphagia, unspecified: Secondary | ICD-10-CM | POA: Diagnosis not present

## 2022-12-13 DIAGNOSIS — I10 Essential (primary) hypertension: Secondary | ICD-10-CM | POA: Diagnosis present

## 2022-12-13 DIAGNOSIS — I5032 Chronic diastolic (congestive) heart failure: Secondary | ICD-10-CM | POA: Diagnosis present

## 2022-12-13 DIAGNOSIS — M25521 Pain in right elbow: Secondary | ICD-10-CM | POA: Diagnosis not present

## 2022-12-13 DIAGNOSIS — R41841 Cognitive communication deficit: Secondary | ICD-10-CM | POA: Diagnosis not present

## 2022-12-13 DIAGNOSIS — M25572 Pain in left ankle and joints of left foot: Secondary | ICD-10-CM | POA: Diagnosis not present

## 2022-12-13 DIAGNOSIS — Z7401 Bed confinement status: Secondary | ICD-10-CM | POA: Diagnosis not present

## 2022-12-13 DIAGNOSIS — J9811 Atelectasis: Secondary | ICD-10-CM | POA: Diagnosis not present

## 2022-12-13 DIAGNOSIS — R2681 Unsteadiness on feet: Secondary | ICD-10-CM | POA: Diagnosis not present

## 2022-12-13 DIAGNOSIS — R4189 Other symptoms and signs involving cognitive functions and awareness: Secondary | ICD-10-CM | POA: Diagnosis present

## 2022-12-13 DIAGNOSIS — S32511A Fracture of superior rim of right pubis, initial encounter for closed fracture: Secondary | ICD-10-CM | POA: Diagnosis not present

## 2022-12-13 LAB — CBC WITH DIFFERENTIAL/PLATELET
Abs Immature Granulocytes: 0.03 10*3/uL (ref 0.00–0.07)
Basophils Absolute: 0.1 10*3/uL (ref 0.0–0.1)
Basophils Relative: 1 %
Eosinophils Absolute: 0.1 10*3/uL (ref 0.0–0.5)
Eosinophils Relative: 1 %
HCT: 33.8 % — ABNORMAL LOW (ref 36.0–46.0)
Hemoglobin: 11.4 g/dL — ABNORMAL LOW (ref 12.0–15.0)
Immature Granulocytes: 0 %
Lymphocytes Relative: 17 %
Lymphs Abs: 1.4 10*3/uL (ref 0.7–4.0)
MCH: 31.6 pg (ref 26.0–34.0)
MCHC: 33.7 g/dL (ref 30.0–36.0)
MCV: 93.6 fL (ref 80.0–100.0)
Monocytes Absolute: 0.6 10*3/uL (ref 0.1–1.0)
Monocytes Relative: 7 %
Neutro Abs: 6.1 10*3/uL (ref 1.7–7.7)
Neutrophils Relative %: 74 %
Platelets: 175 10*3/uL (ref 150–400)
RBC: 3.61 MIL/uL — ABNORMAL LOW (ref 3.87–5.11)
RDW: 12.6 % (ref 11.5–15.5)
WBC: 8.2 10*3/uL (ref 4.0–10.5)
nRBC: 0 % (ref 0.0–0.2)

## 2022-12-13 LAB — COMPREHENSIVE METABOLIC PANEL
ALT: 13 U/L (ref 0–44)
AST: 17 U/L (ref 15–41)
Albumin: 3 g/dL — ABNORMAL LOW (ref 3.5–5.0)
Alkaline Phosphatase: 126 U/L (ref 38–126)
Anion gap: 11 (ref 5–15)
BUN: 24 mg/dL — ABNORMAL HIGH (ref 8–23)
CO2: 25 mmol/L (ref 22–32)
Calcium: 8.4 mg/dL — ABNORMAL LOW (ref 8.9–10.3)
Chloride: 101 mmol/L (ref 98–111)
Creatinine, Ser: 0.86 mg/dL (ref 0.44–1.00)
GFR, Estimated: >60 mL/min (ref >60)
Glucose, Bld: 102 mg/dL — ABNORMAL HIGH (ref 70–99)
Potassium: 3.8 mmol/L (ref 3.5–5.1)
Sodium: 137 mmol/L (ref 135–145)
Total Bilirubin: 1 mg/dL (ref 0.3–1.2)
Total Protein: 6 g/dL — ABNORMAL LOW (ref 6.5–8.1)

## 2022-12-13 LAB — CK: Total CK: 35 U/L — ABNORMAL LOW (ref 38–234)

## 2022-12-13 MED ORDER — HEPARIN SODIUM (PORCINE) 5000 UNIT/ML IJ SOLN
5000.0000 [IU] | Freq: Three times a day (TID) | INTRAMUSCULAR | Status: DC
  Administered 2022-12-14 – 2022-12-16 (×9): 5000 [IU] via SUBCUTANEOUS
  Filled 2022-12-13 (×9): qty 1

## 2022-12-13 MED ORDER — TETANUS-DIPHTH-ACELL PERTUSSIS 5-2.5-18.5 LF-MCG/0.5 IM SUSY
0.5000 mL | PREFILLED_SYRINGE | Freq: Once | INTRAMUSCULAR | Status: AC
  Administered 2022-12-13: 0.5 mL via INTRAMUSCULAR
  Filled 2022-12-13: qty 0.5

## 2022-12-13 MED ORDER — MORPHINE SULFATE (PF) 2 MG/ML IV SOLN: 1.0000 mg | INTRAVENOUS | Status: DC | PRN

## 2022-12-13 MED ORDER — OXYCODONE HCL 5 MG PO TABS: 5.0000 mg | ORAL_TABLET | ORAL | Status: DC | PRN

## 2022-12-13 MED ORDER — ACETAMINOPHEN 325 MG PO TABS
650.0000 mg | ORAL_TABLET | Freq: Four times a day (QID) | ORAL | Status: DC | PRN
  Administered 2022-12-14 – 2022-12-15 (×3): 650 mg via ORAL
  Filled 2022-12-13 (×3): qty 2

## 2022-12-13 MED ORDER — ACETAMINOPHEN 650 MG RE SUPP: 650.0000 mg | Freq: Four times a day (QID) | RECTAL | Status: DC | PRN

## 2022-12-13 MED ORDER — ONDANSETRON HCL 4 MG/2ML IJ SOLN: 4.0000 mg | Freq: Four times a day (QID) | INTRAMUSCULAR | Status: DC | PRN

## 2022-12-13 MED ORDER — SENNOSIDES-DOCUSATE SODIUM 8.6-50 MG PO TABS: 1.0000 | ORAL_TABLET | Freq: Every evening | ORAL | Status: DC | PRN

## 2022-12-13 MED ORDER — ONDANSETRON HCL 4 MG PO TABS: 4.0000 mg | ORAL_TABLET | Freq: Four times a day (QID) | ORAL | Status: DC | PRN

## 2022-12-13 MED ORDER — FENTANYL CITRATE PF 50 MCG/ML IJ SOSY
50.0000 ug | PREFILLED_SYRINGE | Freq: Once | INTRAMUSCULAR | Status: AC
  Administered 2022-12-13: 50 ug via INTRAVENOUS
  Filled 2022-12-13: qty 1

## 2022-12-13 NOTE — H&P (Signed)
History and Physical    Andrea [name] [id number] DOB: 1920/04/07 DOA: 12/13/2022  PCP: Leeroy Cha, MD  Patient coming from: Home  I have personally briefly reviewed patient's old medical records in [place]  Chief Complaint: Right hip pain after fall  HPI: Andrea [name] is a 89 y.o. female with medical history significant for chronic diastolic CHF, severe mitral regurgitation, HTN, chronic right hip dislocation ambulatory with a walker who presented to the ED for evaluation of right hip pain after a fall at home.  History is limited from patient due to suspected cognitive impairment and is otherwise supplemented by EDP, chart review, and son at bedside.  Patient lives with son who is present on admission.  He says patient does have significant short-term memory issues and he suspects she has moderate cognitive impairment.  She has chronic right hip dislocation and was advised against surgical intervention years ago.  She has been ambulatory with a walker until she fell in the bathroom yesterday where her son found her.  She was unable to stand or ambulate therefore was brought to the ED for further evaluation.  ED Course  Labs/Imaging on admission: I have personally reviewed following labs and imaging studies.  Initial vitals showed BP 150/71, pulse 80, RR 15, temp 97.6 F, SpO2 99% on room air.  Labs showed WBC 8.2, hemoglobin 11.4, platelets 175,000, sodium 137, potassium 3.8, bicarb 25, BUN 24, creatinine 0.86, serum glucose 102, CK 35.  CT pelvis without contrast showed acute nondisplaced fractures of the right superior and inferior pubic rami.  Acute nondisplaced fracture of the sacrum at S3 level with presacral edema and stranding along the right pelvic sidewall noted.  Chronic changes of the right hip with joint effusion and fluid distention of the bursa also seen.  CT head and cervical spine without contrast were negative for acute findings.  Portable  chest x-ray showed mild basilar atelectasis.  Advanced degenerative changes of the shoulders.  Cortical irregularity along the humeral neck medially on the left noted.  Right elbow x-ray shows osteopenia without acute osseous abnormality.  Patient was given fentanyl for pain.  EDP discussed with on-call orthopedics, Dr. Doran Durand, who recommended trauma consult.  EDP discussed with on-call surgeon Dr. Windle Guard, who stated that given patient's age they would not offer any surgical intervention but will see her as a consult at either [facility] or [facility].  The hospitalist service was consulted to admit.  Review of Systems: All systems reviewed and are negative except as documented in history of present illness above.   Past Medical History:  Diagnosis Date   Chronic diastolic heart failure (HCC)    Heart murmur    Hypertension    Osteoporosis     Past Surgical History:  Procedure Laterality Date   CATARACT EXTRACTION      Social History:  reports that she has never smoked. She has never used smokeless tobacco. She reports that she does not drink alcohol and does not use drugs.  No Known Allergies  Family History  Problem Relation Age of Onset   Dementia Mother    Lung cancer Father      Prior to Admission medications   Medication Sig Start Date End Date Taking? Authorizing Provider  Acetaminophen (TYLENOL PO) Take 500 mg by mouth 3 (three) times daily as needed (pain).     [provider]  Calcium Carbonate-Vit D-Min (CALCIUM 1200 PO) Take 1,200 mg by mouth daily.    [provider]  furosemide (  LASIX) 40 MG tablet Take 40 mg by mouth daily. 02/01/20   [provider]  Multiple Vitamin (MULTIVITAMIN WITH MINERALS) TABS Take 1 tablet by mouth daily.    [provider]  potassium chloride (K-DUR) 10 MEQ tablet Take 2 tablets (20 mEq total) by mouth daily. 09/24/12   Geradine Girt, DO  telmisartan (MICARDIS) 80 MG tablet Take 1 tablet (80 mg total) by  mouth daily. 01/25/17   Jerline Pain, MD    Physical Exam: Vitals:   12/13/22 2200 12/13/22 2218 12/13/22 2230 12/13/22 2306  BP: 136/73  (!) 148/106 (!) 156/81  Pulse: 64  87 81  Resp:    18  Temp:  98.3 F (36.8 C)  98.2 F (36.8 C)  TempSrc:  Oral  Oral  SpO2: 96%  94% 97%  Weight:    46.4 kg  Height:    '4\' 10"'$  (1.473 m)   Constitutional: Thin elderly woman resting in bed.  NAD, trying to take off her cardiac monitor. Eyes: EOMI, lids and conjunctivae normal ENMT: Mucous membranes are moist. Posterior pharynx clear of any exudate or lesions.Normal dentition.  Neck: normal, supple, no masses. Respiratory: clear to auscultation anteriorly. Normal respiratory effort. No accessory muscle use.  Cardiovascular: Regular rate and rhythm, 3/6 systolic murmur. No extremity edema. 2+ pedal pulses. Abdomen: no tenderness, no masses palpated.  Musculoskeletal: no clubbing / cyanosis. No joint deformity upper and lower extremities.  ROM RLE diminished due to right pubic rami fractures otherwise intact other extremities. Skin: Skin tear right elbow.  Scabbed over lesion left lower anterior shin. Neurologic:  Sensation intact. Strength 5/5 in bilateral upper extremities and LLE.  Strength diminished RLE due to right pubic rami fractures. Psychiatric: Awake and alert to self, place.  Recognizes son is at bedside.  Gives answers contradictory to what son says regarding situation around fall.  Some short-term memory deficits apparent.  EKG: Not performed.  Assessment/Plan Principal Problem:   Closed fracture of multiple pubic rami, right, initial encounter ([place]) Active Problems:   Closed sacral fracture (HCC)   Severe mitral regurgitation   Chronic diastolic heart failure (HCC)   Essential hypertension   Cognitive impairment   Andrea [name] is a 89 y.o. female with medical history significant for chronic diastolic CHF, severe mitral regurgitation, HTN, chronic right hip dislocation  ambulatory with a walker who is admitted with acute nondisplaced fractures of the right superior and inferior pubic rami and sacrum at the level of S3 after a fall at home.  Assessment and Plan: * Closed fracture of multiple pubic rami, right, initial encounter ([place]) Acute nondisplaced fracture of the sacrum at S3 level Chronic right hip joint effusion and fluid distention of the bursa Occurring after an unwitnessed fall at home.  ROM at the right hip is diminished.  EDP discussed with on-call orthopedics, Dr. Doran Durand, and surgeon, Dr. Kae Heller.  Given her age, no surgical intervention anticipated however they will see in consultation. -Continue analgesics as needed -Fall precautions, PT/OT eval  Cognitive impairment Son reports significant short-term memory loss and concern for cognitive impairment/dementia.  High risk for delirium. -Continue delirium precautions  Essential hypertension Continue telmisartan.  Chronic diastolic heart failure (HCC) Severe mitral regurgitation Appears slightly hypovolemic on admission.  Hold Lasix for now.  DVT prophylaxis: heparin injection 5,000 Units Start: 12/13/22 2215 Code Status: DNR, confirmed with son on admission Family Communication: Son at bedside Disposition Plan: From home, dispo pending clinical progress Consults called: Orthopedics, trauma surgery Severity of  Illness: The appropriate patient status for this patient is INPATIENT. Inpatient status is judged to be reasonable and necessary in order to provide the required intensity of service to ensure the patient's safety. The patient's presenting symptoms, physical exam findings, and initial radiographic and laboratory data in the context of their chronic comorbidities is felt to place them at high risk for further clinical deterioration. Furthermore, it is not anticipated that the patient will be medically stable for discharge from the hospital within 2 midnights of admission.   * I certify  that at the point of admission it is my clinical judgment that the patient will require inpatient hospital care spanning beyond 2 midnights from the point of admission due to high intensity of service, high risk for further deterioration and high frequency of surveillance required.Zada Finders MD [facility]  If 7PM-7AM, please contact night-coverage [email or web address]  12/14/2022, 12:07 AM

## 2022-12-13 NOTE — ED Triage Notes (Signed)
Pt arrives via [facility] from home for a mechanical fall yesterday from a standing position. Pt's son is with her all the time and noticed she is unable to do normal activities she previously has been able to do.   Right hip pain, although patient denies any pain. Pt does not take blood thinners.

## 2022-12-13 NOTE — ED Notes (Signed)
Pt moved to a room closer and visible to nursing station/ pt took cardiac leads off and refuses to put back on/ pt trying to get OOB/bed alarm placed and on/ son is at bedside but will be leaving soon.

## 2022-12-13 NOTE — ED Notes (Signed)
Pt returned from xray

## 2022-12-13 NOTE — ED Provider Notes (Signed)
[facility] DEPT Provider Note   CSN: [id number] Arrival date & time: 12/13/22  1505     History  Chief Complaint  Patient presents with   Fall   Hip Pain    Andrea [name] is a 89 y.o. female hx of chronic R hip dislocation, HTN, here with fall.  Patient lives at home with her son and walks with a walker at baseline.  Patient apparently had a fall yesterday.  Son found her on the floor and noticed some blood in the right scalp area and also she was complaining of right hip pain.  She is unable to walk since then.  Patient has a chronic right hip dislocation due to her age. Patient is not on any blood thinners.  The history is provided by the patient.       Home Medications Prior to Admission medications   Medication Sig Start Date End Date Taking? Authorizing Provider  Acetaminophen (TYLENOL PO) Take 500 mg by mouth 3 (three) times daily as needed (pain).     [provider]  Calcium Carbonate-Vit D-Min (CALCIUM 1200 PO) Take 1,200 mg by mouth daily.    [provider]  furosemide (LASIX) 40 MG tablet Take 40 mg by mouth daily. 02/01/20   [provider]  Multiple Vitamin (MULTIVITAMIN WITH MINERALS) TABS Take 1 tablet by mouth daily.    [provider]  potassium chloride (K-DUR) 10 MEQ tablet Take 2 tablets (20 mEq total) by mouth daily. 09/24/12   Geradine Girt, DO  telmisartan (MICARDIS) 80 MG tablet Take 1 tablet (80 mg total) by mouth daily. 01/25/17   Jerline Pain, MD      Allergies    Patient has no known allergies.    Review of Systems   Review of Systems  Musculoskeletal:        Right hip pain  All other systems reviewed and are negative.   Physical Exam Updated Vital Signs BP (!) 135/97   Pulse 85   Temp 97.6 F (36.4 C) (Oral)   Resp (!) 23   LMP  (LMP Unknown)   SpO2 97%  Physical Exam Vitals and nursing note reviewed.  Constitutional:      Comments: Chronically ill and demented   HENT:     Head: Normocephalic.     Comments: Small abrasion R posterior scalp     Nose: Nose normal.     Mouth/Throat:     Mouth: Mucous membranes are moist.  Eyes:     Extraocular Movements: Extraocular movements intact.     Pupils: Pupils are equal, round, and reactive to light.  Cardiovascular:     Rate and Rhythm: Normal rate and regular rhythm.     Pulses: Normal pulses.     Heart sounds: Normal heart sounds.  Pulmonary:     Effort: Pulmonary effort is normal.     Breath sounds: Normal breath sounds.  Abdominal:     General: Abdomen is flat.     Palpations: Abdomen is soft.  Musculoskeletal:     Cervical back: Normal range of motion and neck supple.     Comments: Tenderness of the right hip and decreased range of motion.  Right hip is not rotated or shortened.  No obvious midline spinal tenderness. Skin tear R elbow   Skin:    General: Skin is warm.     Capillary Refill: Capillary refill takes less than 2 seconds.  Neurological:     General: No focal  deficit present.     Mental Status: She is oriented to person, place, and time.  Psychiatric:        Mood and Affect: Mood normal.        Behavior: Behavior normal.     ED Results / Procedures / Treatments   Labs (all labs ordered are listed, but only abnormal results are displayed) Labs Reviewed  CBC WITH DIFFERENTIAL/PLATELET  COMPREHENSIVE METABOLIC PANEL    EKG None  Radiology No results found.  Procedures Procedures    Medications Ordered in ED Medications  fentaNYL (SUBLIMAZE) injection 50 mcg (has no administration in time range)  Tdap (BOOSTRIX) injection 0.5 mL (has no administration in time range)    ED Course/ Medical Decision Making/ A&P                           Medical Decision Making Andrea [name] is a 89 y.o. female here presenting with fall with right hip pain.  Patient also has signs of head injury.  Will get CT head and cervical spine to rule out a bleed or fracture.  She has  chronic right hip dislocation that was not operated on.  Concern for another dislocation versus fracture.  Will get x-ray to rule out fracture and patient may need a CT pelvis to rule out pelvic fracture or occult hip fracture.  Will get preop labs as well.   8:10 PM X-ray showed possible hip versus pelvic fracture.  CT showed acute nondisplaced fractures of the right superior and inferior pubic rami as well as sacral fractures with presacral edema.  I discussed case with the orthopedic doctor, Dr. Doran Durand.  He recommends trauma consult and hospitalist admission.  He states that patient may need to be transferred to [facility].  I discussed case with the on-call general surgeon, Dr. Kae Heller.  She states that given her age, they would not offer any surgical intervention but they will see her as a consult and states that patient can stay here and surgery is happy to see patient as a consult here or at [facility]. Hospitalist to admit   Problems Addressed: Closed displaced fracture of pelvis, unspecified part of pelvis, initial encounter [facility]): acute illness or injury Closed fracture of sacrum, unspecified portion of sacrum, initial encounter [facility]): acute illness or injury  Amount and/or Complexity of Data Reviewed Labs: ordered. Decision-making details documented in ED Course. Radiology: ordered and independent interpretation performed. Decision-making details documented in ED Course.  Risk Prescription drug management.   Final Clinical Impression(s) / ED Diagnoses Final diagnoses:  None    Rx / DC Orders ED Discharge Orders     None         Drenda Freeze, MD 12/13/22 2016

## 2022-12-13 NOTE — ED Notes (Signed)
ED TO INPATIENT HANDOFF REPORT  Name/Age/Gender Andrea [name] 89 y.o. female  Code Status    Code Status Orders  (From admission, onward)           Start     Ordered   12/13/22 2203  Do not attempt resuscitation (DNR)  Continuous       Question Answer Comment  If patient has no pulse and is not breathing Do Not Attempt Resuscitation   If patient has a pulse and/or is breathing: Medical Treatment Goals COMFORT MEASURES: Keep clean/warm/dry, use medication by any route; positioning, wound care and other measures to relieve pain/suffering; use oxygen, suction/manual treatment of airway obstruction for comfort; do not transfer unless for comfort needs.   Consent: Discussion documented in EHR or advanced directives reviewed      12/13/22 2204           Code Status History     Date Active Date Inactive Code Status Order ID Comments User Context   09/22/2012 1236 09/24/2012 2033 DNR [id number]  Geradine Girt, DO Inpatient       Home/SNF/Other Home  Chief Complaint Closed fracture of multiple pubic rami, right, initial encounter ([place]) [S32.591A]  Level of Care/Admitting Diagnosis ED Disposition     ED Disposition  Admit   Condition  --   [place]: [place] [[id number]]  Level of Care: Med-Surg [16]  May admit patient to [facility] or [facility] if equivalent level of care is available:: No  Covid Evaluation: Asymptomatic - no recent exposure (last 10 days) testing not required  Diagnosis: Closed fracture of multiple pubic rami, right, initial encounter [facility]) [[id number]]  Admitting Physician: Lenore Cordia [[id number]]  Attending Physician: Lenore Cordia [[id number]]  Certification:: I certify this patient will need inpatient services for at least 2 midnights  Estimated Length of Stay: 2          Medical History Past Medical History:  Diagnosis Date   Chronic diastolic heart failure ([place])    Heart murmur    Hypertension     Osteoporosis     Allergies No Known Allergies  IV Location/Drains/Wounds Patient Lines/Drains/Airways Status     Active Line/Drains/Airways     Name Placement date Placement time Site Days   Peripheral IV 12/13/22 20 G 1" Left Antecubital 12/13/22  1709  Antecubital  less than 1            Labs/Imaging Results for orders placed or performed during the hospital encounter of 12/13/22 (from the past 48 hour(s))  CBC with Differential     Status: Abnormal   Collection Time: 12/13/22  5:08 PM  Result Value Ref Range   WBC 8.2 4.0 - 10.5 K/uL   RBC 3.61 (L) 3.87 - 5.11 MIL/uL   Hemoglobin 11.4 (L) 12.0 - 15.0 g/dL   HCT 33.8 (L) 36.0 - 46.0 %   MCV 93.6 80.0 - 100.0 fL   MCH 31.6 26.0 - 34.0 pg   MCHC 33.7 30.0 - 36.0 g/dL   RDW 12.6 11.5 - 15.5 %   Platelets 175 150 - 400 K/uL   nRBC 0.0 0.0 - 0.2 %   Neutrophils Relative % 74 %   Neutro Abs 6.1 1.7 - 7.7 K/uL   Lymphocytes Relative 17 %   Lymphs Abs 1.4 0.7 - 4.0 K/uL   Monocytes Relative 7 %   Monocytes Absolute 0.6 0.1 - 1.0 K/uL   Eosinophils Relative 1 %   Eosinophils  Absolute 0.1 0.0 - 0.5 K/uL   Basophils Relative 1 %   Basophils Absolute 0.1 0.0 - 0.1 K/uL   Immature Granulocytes 0 %   Abs Immature Granulocytes 0.03 0.00 - 0.07 K/uL    Comment: Performed at [facility], [place] [address], [place], [place] [address]  Comprehensive metabolic panel     Status: Abnormal   Collection Time: 12/13/22  5:08 PM  Result Value Ref Range   Sodium 137 135 - 145 mmol/L   Potassium 3.8 3.5 - 5.1 mmol/L   Chloride 101 98 - 111 mmol/L   CO2 25 22 - 32 mmol/L   Glucose, Bld 102 (H) 70 - 99 mg/dL    Comment: Glucose reference range applies only to samples taken after fasting for at least 8 hours.   BUN 24 (H) 8 - 23 mg/dL   Creatinine, Ser 0.86 0.44 - 1.00 mg/dL   Calcium 8.4 (L) 8.9 - 10.3 mg/dL   Total Protein 6.0 (L) 6.5 - 8.1 g/dL   Albumin 3.0 (L) 3.5 - 5.0 g/dL   AST 17 15 - 41 U/L   ALT 13 0 -  44 U/L   Alkaline Phosphatase 126 38 - 126 U/L   Total Bilirubin 1.0 0.3 - 1.2 mg/dL   GFR, Estimated >60 >60 mL/min    Comment: (NOTE) Calculated using the CKD-EPI Creatinine Equation (2021)    Anion gap 11 5 - 15    Comment: Performed at [facility], [place] [address] [place], [place] [address]  CK     Status: Abnormal   Collection Time: 12/13/22  5:08 PM  Result Value Ref Range   Total CK 35 (L) 38 - 234 U/L    Comment: Performed at [facility], [place] [address], [place], [place] [address]   CT PELVIS WO CONTRAST  Result Date: 12/13/2022 CLINICAL DATA:  Hip trauma EXAM: CT PELVIS WITHOUT CONTRAST TECHNIQUE: Multidetector CT imaging of the pelvis was performed following the standard protocol without intravenous contrast. RADIATION DOSE REDUCTION: This exam was performed according to the departmental dose-optimization program which includes automated exposure control, adjustment of the mA and/or kV according to patient size and/or use of iterative reconstruction technique. COMPARISON:  X-ray same day FINDINGS: Urinary Tract:  No abnormality visualized. Bowel:  Unremarkable visualized pelvic bowel loops. Vascular/Lymphatic: There are atherosclerotic calcifications of the aorta and iliac arteries. Reproductive:  Small calcified uterine fibroids are present. Other: There is no free fluid. There is presacral edema and some stranding along the right pelvic sidewall. There is no focal hernia. Musculoskeletal: The bones are diffusely osteopenic. There is nondisplaced fracture along the anterior cortex of the S3 level. There are subtle nondisplaced acute fractures of the right superior and inferior pubic rami. There are chronic changes of the right hip with flattening and remodeling of the right femoral head. There is acetabular remodeling and sclerosis. There is increased joint space and moderate-sized joint effusion. There is fluid distention of the bursa anteriorly.  IMPRESSION: 1. Acute nondisplaced fractures of the right superior and inferior pubic rami. 2. Acute nondisplaced fracture of the sacrum at the S3 level. 3. Presacral edema and stranding along the right pelvic sidewall. 4. Chronic changes of the right hip with joint effusion and fluid distention of the bursa. Findings may be related to chronic AVN or infection. Aortic Atherosclerosis (ICD10-I70.0). Electronically Signed   By: Ronney Asters M.D.   On: 12/13/2022 19:30   CT HEAD WO CONTRAST (5MM)  Result Date: 12/13/2022 CLINICAL DATA:  Fall.  Head trauma. EXAM: CT HEAD WITHOUT CONTRAST CT CERVICAL SPINE WITHOUT CONTRAST TECHNIQUE: Multidetector CT imaging of the head and cervical spine was performed following the standard protocol without intravenous contrast. Multiplanar CT image reconstructions of the cervical spine were also generated. RADIATION DOSE REDUCTION: This exam was performed according to the departmental dose-optimization program which includes automated exposure control, adjustment of the mA and/or kV according to patient size and/or use of iterative reconstruction technique. COMPARISON:  None Available. FINDINGS: CT HEAD FINDINGS Brain: No evidence of acute infarction, hemorrhage, hydrocephalus, extra-axial collection or mass lesion/mass effect. There is age related ventricular sulcal enlargement. Old right basal ganglia lacunar infarct. Patchy areas of white matter hypoattenuation consistent with mild chronic microvascular ischemic change. Vascular: No hyperdense vessel or unexpected calcification. Skull: Normal. Negative for fracture or focal lesion. Sinuses/Orbits: Globes and orbits are unremarkable. Visualized sinuses are clear. Other: None. CT CERVICAL SPINE FINDINGS Alignment: Grade 1 anterolisthesis of C7 on T1. No other spondylolisthesis. Generalized straightened cervical lordosis. Skull base and vertebrae: No acute fracture. No primary bone lesion or focal pathologic process. Soft tissues  and spinal canal: No prevertebral fluid or swelling. No visible canal hematoma. Disc levels: Marked loss of disc height from C2-C3 through C4-C5. Moderate loss of disc height at C5-C6 and C6-C7. Bilateral facet degenerative changes in the mid to upper cervical spine. No convincing disc herniation. Upper chest: No acute findings.  Lung apices are clear. Other: None. IMPRESSION: HEAD CT 1. No acute intracranial abnormalities. CERVICAL CT 1. No fracture or acute finding. Electronically Signed   By: Lajean Manes M.D.   On: 12/13/2022 19:23   CT Cervical Spine Wo Contrast  Result Date: 12/13/2022 CLINICAL DATA:  Fall.  Head trauma. EXAM: CT HEAD WITHOUT CONTRAST CT CERVICAL SPINE WITHOUT CONTRAST TECHNIQUE: Multidetector CT imaging of the head and cervical spine was performed following the standard protocol without intravenous contrast. Multiplanar CT image reconstructions of the cervical spine were also generated. RADIATION DOSE REDUCTION: This exam was performed according to the departmental dose-optimization program which includes automated exposure control, adjustment of the mA and/or kV according to patient size and/or use of iterative reconstruction technique. COMPARISON:  None Available. FINDINGS: CT HEAD FINDINGS Brain: No evidence of acute infarction, hemorrhage, hydrocephalus, extra-axial collection or mass lesion/mass effect. There is age related ventricular sulcal enlargement. Old right basal ganglia lacunar infarct. Patchy areas of white matter hypoattenuation consistent with mild chronic microvascular ischemic change. Vascular: No hyperdense vessel or unexpected calcification. Skull: Normal. Negative for fracture or focal lesion. Sinuses/Orbits: Globes and orbits are unremarkable. Visualized sinuses are clear. Other: None. CT CERVICAL SPINE FINDINGS Alignment: Grade 1 anterolisthesis of C7 on T1. No other spondylolisthesis. Generalized straightened cervical lordosis. Skull base and vertebrae: No acute  fracture. No primary bone lesion or focal pathologic process. Soft tissues and spinal canal: No prevertebral fluid or swelling. No visible canal hematoma. Disc levels: Marked loss of disc height from C2-C3 through C4-C5. Moderate loss of disc height at C5-C6 and C6-C7. Bilateral facet degenerative changes in the mid to upper cervical spine. No convincing disc herniation. Upper chest: No acute findings.  Lung apices are clear. Other: None. IMPRESSION: HEAD CT 1. No acute intracranial abnormalities. CERVICAL CT 1. No fracture or acute finding. Electronically Signed   By: Lajean Manes M.D.   On: 12/13/2022 19:23   DG Elbow Complete Right  Result Date: 12/13/2022 CLINICAL DATA:  Mechanical fall yesterday with pain EXAM: RIGHT ELBOW -  COMPLETE 4 VIEW COMPARISON:  None Available. FINDINGS: There is no evidence of fracture, dislocation, or joint effusion. There is no evidence of arthropathy or other focal bone abnormality. Soft tissues are unremarkable. Osteopenia. IMPRESSION: No acute osseous abnormality.  Osteopenia. Electronically Signed   By: Jill Side M.D.   On: 12/13/2022 17:48   DG Hip Unilat W or Wo Pelvis 2-3 Views Right  Result Date: 12/13/2022 CLINICAL DATA:  Pain after fall EXAM: DG HIP (WITH OR WITHOUT PELVIS) 3V RIGHT COMPARISON:  X-ray 07/22/2014 FINDINGS: There is progressive severe deformity of the right femoral head consistent with progressive collapse please correlate for any history of AVN. There is some bony remodeling of the acetabulum medially with some protrusio. Mild concentric joint space loss of the left hip. No acute fracture or dislocation. However there is severe osteopenia and if there is further concern follow-up cross-sectional imaging may be of benefit to assess for an occult abnormality. Overlapping cardiac leads. Catheter. Rectal stool. IMPRESSION: Progressive deformity and collapse of the right femoral head and bony remodeling of the adjacent acetabulum. Please correlate for  any known history of AVN. Degenerative changes of the left hip. Severe osteopenia. No obvious fracture or dislocation although with this level of osteopenia a subtle nondisplaced injury is difficult to completely exclude and if needed additional cross-sectional imaging as clinically directed. Electronically Signed   By: Jill Side M.D.   On: 12/13/2022 17:26   DG Chest 1 View  Result Date: 12/13/2022 CLINICAL DATA:  Mechanical fall yesterday.  Pain EXAM: CHEST  1 VIEW COMPARISON:  Two-view x-ray 2016 January FINDINGS: Underinflation with borderline cardiopericardial silhouette. Mild basilar atelectasis or scar. No consolidation, pneumothorax or effusion. No edema. Overlapping cardiac leads. Advanced degenerative changes of the shoulders. There is also cortical irregularity along the left humeral neck. Please correlate for any history of injury or dedicated shoulder x-rays are recommended when appropriate. IMPRESSION: Borderline size heart.  Mild basilar atelectasis. Advanced degenerative changes of the shoulders. Cortical irregularity along the humeral neck medially on the left. Please correlate with any known history or dedicated x-ray when appropriate to assess for injury. Electronically Signed   By: Jill Side M.D.   On: 12/13/2022 17:23    Pending Labs Unresulted Labs (From admission, onward)     Start     Ordered   12/14/22 0500  CBC  Tomorrow morning,   R        12/13/22 2204   12/14/22 5027  Basic metabolic panel  Tomorrow morning,   R        12/13/22 2204            Vitals/Pain Today's Vitals   12/13/22 1830 12/13/22 1913 12/13/22 1930 12/13/22 2030  BP: (!) 147/92 (!) 163/73 (!) 163/83 (!) 158/120  Pulse: 92 89 75 88  Resp: '15 20 13 20  '$ Temp:  98 F (36.7 C)    TempSrc:  Oral    SpO2: 98% 99% 99% 98%  PainSc:        Isolation Precautions No active isolations  Medications Medications  heparin injection 5,000 Units (has no administration in time range)  acetaminophen  (TYLENOL) tablet 650 mg (has no administration in time range)    Or  acetaminophen (TYLENOL) suppository 650 mg (has no administration in time range)  oxyCODONE (Oxy IR/ROXICODONE) immediate release tablet 5 mg (has no administration in time range)  morphine (PF) 2 MG/ML injection 1 mg (has no administration in time range)  senna-docusate (Senokot-S) tablet  1 tablet (has no administration in time range)  ondansetron (ZOFRAN) tablet 4 mg (has no administration in time range)    Or  ondansetron (ZOFRAN) injection 4 mg (has no administration in time range)  fentaNYL (SUBLIMAZE) injection 50 mcg (50 mcg Intravenous Given 12/13/22 1725)  Tdap (BOOSTRIX) injection 0.5 mL (0.5 mLs Intramuscular Given 12/13/22 1728)    Mobility non-ambulatory

## 2022-12-13 NOTE — Progress Notes (Signed)
89 y/o female with right superior and inferior pubic rami fractures as well as sacral fracture after a fall.  Pt may eat.  She is unlikely to need surgical treatment.  Consult note to follow in AM.  OK to give blood thinners.

## 2022-12-14 DIAGNOSIS — S32591A Other specified fracture of right pubis, initial encounter for closed fracture: Secondary | ICD-10-CM | POA: Diagnosis not present

## 2022-12-14 DIAGNOSIS — R4189 Other symptoms and signs involving cognitive functions and awareness: Secondary | ICD-10-CM | POA: Diagnosis present

## 2022-12-14 LAB — BASIC METABOLIC PANEL
Anion gap: 7 (ref 5–15)
BUN: 24 mg/dL — ABNORMAL HIGH (ref 8–23)
CO2: 25 mmol/L (ref 22–32)
Calcium: 8.3 mg/dL — ABNORMAL LOW (ref 8.9–10.3)
Chloride: 106 mmol/L (ref 98–111)
Creatinine, Ser: 0.76 mg/dL (ref 0.44–1.00)
GFR, Estimated: >60 mL/min (ref >60)
Glucose, Bld: 118 mg/dL — ABNORMAL HIGH (ref 70–99)
Potassium: 3.5 mmol/L (ref 3.5–5.1)
Sodium: 138 mmol/L (ref 135–145)

## 2022-12-14 LAB — CBC
HCT: 32.5 % — ABNORMAL LOW (ref 36.0–46.0)
Hemoglobin: 11 g/dL — ABNORMAL LOW (ref 12.0–15.0)
MCH: 32.1 pg (ref 26.0–34.0)
MCHC: 33.8 g/dL (ref 30.0–36.0)
MCV: 94.8 fL (ref 80.0–100.0)
Platelets: 169 10*3/uL (ref 150–400)
RBC: 3.43 MIL/uL — ABNORMAL LOW (ref 3.87–5.11)
RDW: 12.5 % (ref 11.5–15.5)
WBC: 6.8 10*3/uL (ref 4.0–10.5)
nRBC: 0 % (ref 0.0–0.2)

## 2022-12-14 MED ORDER — IRBESARTAN 150 MG PO TABS
300.0000 mg | ORAL_TABLET | Freq: Every day | ORAL | Status: DC
  Administered 2022-12-14 – 2022-12-16 (×3): 300 mg via ORAL
  Filled 2022-12-14 (×3): qty 2

## 2022-12-14 NOTE — Evaluation (Signed)
Physical Therapy Evaluation Patient Details Name: Andrea [name] MRN: [id number] DOB: Apr 18, 1920 Today's Date: 12/14/2022  History of Present Illness  89 y.o. female after an unwitnessed fall in the bathroom and her son found her. She was unable to ambulate therefore he brought her to the [facility] up revealed nondisplaced right superior/ inferior pubic rami fractures, nondisplaced S3 fracture with presacral edema/stranding along right pelvic sidewall. CT head and c-spine negative   PMH: CHF, severe mitral regurgitation, HTN, chronic right hip dislocation, cognitive impairment/ dementia, osteopenia  Clinical Impression  Pt admitted with above diagnosis.  Pt is pleasant and cooperative, oriented to self, follows one step commands consistently. Overall min assist for bed mobility and STS transfer. Deferred amb attempt d/t ortho consult not completed/no WBing orders at time of PT eval. Will continue to follow for updates, progress pt as able. Pt has been up to [facility] with nurisng staff.  Pt currently with functional limitations due to the deficits listed below (see PT Problem List). Pt will benefit from skilled PT to increase their independence and safety with mobility to allow discharge to the venue listed below.          Recommendations for follow up therapy are one component of a multi-disciplinary discharge planning process, led by the attending physician.  Recommendations may be updated based on patient status, additional functional criteria and insurance authorization.  Follow Up Recommendations Skilled nursing-short term rehab (<3 hours/day) Can patient physically be transported by private vehicle: No    Assistance Recommended at Discharge Frequent or constant Supervision/Assistance  Patient can return home with the following  A little help with walking and/or transfers;A little help with bathing/dressing/bathroom;Assistance with cooking/housework;Assist for transportation;Help with stairs or  ramp for entrance    Equipment Recommendations None recommended by PT  Recommendations for Other Services       Functional Status Assessment Patient has had a recent decline in their functional status and demonstrates the ability to make significant improvements in function in a reasonable and predictable amount of time.     Precautions / Restrictions Precautions Precautions: Fall Restrictions Weight Bearing Restrictions: No Other Position/Activity Restrictions: no restrictions per orders, no WBing status ordered; ortho consulted- note not in at time of PT eval--?WBAT LEs, await clarification per ortho MD      Mobility  Bed Mobility Overal bed mobility: Needs Assistance Bed Mobility: Supine to Sit, Sit to Supine     Supine to sit: Min assist Sit to supine: Min assist   General bed mobility comments: assist to elevate trunk, bed pad utilized to complete scooting to EOB, good pt effort to self assist. pt able to scoot laterally toward [facility] with min assist; assist to lift LEs on to bed for return to supine    Transfers Overall transfer level: Needs assistance Equipment used: 1 person hand held assist Transfers: Sit to/from Stand Sit to Stand: Min assist           General transfer comment: assist to rise and steady. deferred amb attempt d/t no WBing orders + pt with incr pain with any attempt to wt shift in standing    Ambulation/Gait                  Stairs            Wheelchair Mobility    Modified Rankin (Stroke Patients Only)       Balance Overall balance assessment: Needs assistance Sitting-balance support: No upper extremity supported, Feet supported Sitting balance-Leahy Scale: Good  Standing balance support: Single extremity supported Standing balance-Leahy Scale: Poor Standing balance comment: external and UE assist needed                             Pertinent Vitals/Pain Pain Assessment Pain Assessment: Faces Faces  Pain Scale: Hurts little more Pain Location: RLE, right heel Pain Descriptors / Indicators: Grimacing Pain Intervention(s): Limited activity within patient's tolerance, Monitored during session, Repositioned    Home Living Family/patient expects to be discharged to:: Skilled nursing facility Living Arrangements: Children Available Help at Discharge: Family               Additional Comments: lives with son, assists as needed per previous adm notes    Prior Function Prior Level of Function : Patient poor historian/Family not available             Mobility Comments: ambulatory with RW per chart; pt is not a reliable historian however tells me she walks without a walker or cane "most of the time"       Hand Dominance        Extremity/Trunk Assessment   Upper Extremity Assessment Upper Extremity Assessment: Defer to OT evaluation    Lower Extremity Assessment Lower Extremity Assessment: RLE deficits/detail;LLE deficits/detail RLE Deficits / Details: AAROM WFL, knee and hip grossly 2+/5, ankle WFL LLE Deficits / Details: AROM WFL, knee and hip ~ 3 to 3+/5, testing limtied by pain, ankle WFL       Communication   Communication: HOH (has hearing aides)  Cognition Arousal/Alertness: Awake/alert Behavior During Therapy: WFL for tasks assessed/performed Overall Cognitive Status: History of cognitive impairments - at baseline                                 General Comments: pt is oriented to self, knows she is not at home; follows one step commands consistently        General Comments      Exercises     Assessment/Plan    PT Assessment Patient needs continued PT services  PT Problem List Decreased strength;Decreased activity tolerance;Decreased balance;Decreased mobility;Decreased knowledge of use of DME;Decreased cognition;Pain       PT Treatment Interventions DME instruction;Therapeutic exercise;Gait training;Functional mobility  training;Therapeutic activities;Patient/family education    PT Goals (Current goals can be found in the Care Plan section)  Acute Rehab PT Goals PT Goal Formulation: With patient Time For Goal Achievement: 12/28/22 Potential to Achieve Goals: Good    Frequency Min 3X/week     Co-evaluation               AM-PAC PT "6 Clicks" Mobility  Outcome Measure Help needed turning from your back to your side while in a flat bed without using bedrails?: A Little Help needed moving from lying on your back to sitting on the side of a flat bed without using bedrails?: A Little Help needed moving to and from a bed to a chair (including a wheelchair)?: A Little Help needed standing up from a chair using your arms (e.g., wheelchair or bedside chair)?: A Little Help needed to walk in hospital room?: A Lot Help needed climbing 3-5 steps with a railing? : Total 6 Click Score: 15    End of Session   Activity Tolerance: Patient tolerated treatment well;Patient limited by pain Patient left: in bed;with call bell/phone within reach;with bed alarm set  PT Visit Diagnosis: Other abnormalities of gait and mobility (R26.89);Difficulty in walking, not elsewhere classified (R26.2);Pain Pain - Right/Left: Right Pain - part of body: Hip    Time: 4720-7218 PT Time Calculation (min) (ACUTE ONLY): 11 min   Charges:   PT Evaluation $PT Eval Low Complexity: [place], PT  Acute Rehab Dept [facility]) [phone]  [facility] Weekend Pager [facility] only)  [phone]  12/14/2022   [facility] 12/14/2022, 11:17 AM

## 2022-12-14 NOTE — Assessment & Plan Note (Signed)
CT imaging revealing acute nondisplaced fractures of the right superior and inferior pubic rami CT additionally identifying acute nondisplaced fracture of the sacrum at the S3 level Patient evaluated by orthopedic surgery.  They are recommending nonsurgical conservative management with outpatient follow-up with Dr. Doran Durand in 3 weeks. PT/OT evaluations Skilled nursing facility placement for skilled physical therapy As needed analgesics with nonopiate-based analgesics -scheduled Tylenol twice daily Obtaining vitamin D level

## 2022-12-14 NOTE — Plan of Care (Signed)
  Problem: Education: Goal: Knowledge of General Education information will improve Description: Including pain rating scale, medication(s)/side effects and non-pharmacologic comfort measures 12/14/2022 0028 by Charlaine Dalton, RN Outcome: Progressing 12/14/2022 0028 by Charlaine Dalton, RN Outcome: Progressing   Problem: Nutrition: Goal: Adequate nutrition will be maintained 12/14/2022 0028 by Charlaine Dalton, RN Outcome: Progressing 12/14/2022 0028 by Charlaine Dalton, RN Outcome: Progressing   Problem: Coping: Goal: Level of anxiety will decrease 12/14/2022 0028 by Charlaine Dalton, RN Outcome: Progressing 12/14/2022 0028 by Charlaine Dalton, RN Outcome: Progressing   Problem: Pain Managment: Goal: General experience of comfort will improve 12/14/2022 0028 by Charlaine Dalton, RN Outcome: Progressing 12/14/2022 0028 by Charlaine Dalton, RN Outcome: Progressing   Problem: Safety: Goal: Ability to remain free from injury will improve 12/14/2022 0028 by Charlaine Dalton, RN Outcome: Progressing 12/14/2022 0028 by Charlaine Dalton, RN Outcome: Progressing   Problem: Skin Integrity: Goal: Risk for impaired skin integrity will decrease 12/14/2022 0028 by Charlaine Dalton, RN Outcome: Progressing 12/14/2022 0028 by Charlaine Dalton, RN Outcome: Progressing
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
## 2022-12-14 NOTE — Assessment & Plan Note (Signed)
Son reports significant short-term memory loss and concern for cognitive impairment/dementia.  High risk for delirium. -Continue delirium precautions

## 2022-12-14 NOTE — Evaluation (Signed)
Occupational Therapy Evaluation Patient Details Name: Andrea [name] MRN: [id number] DOB: 1920-07-11 Today's Date: 12/14/2022   History of Present Illness Patient is a 89 year old female who presented after a fall at home resulting in right hip pain.imaging revealed  "acute nondisplaced fractures of the right superior and inferior pubic rami as well as sacral fractures with presacral edema".     PMH: chronic R hip dislocation, CHF, severe mitral regurgitation, HTN.   Clinical Impression   Patient is a 89 year old female who was admitted for above. Patient was living at home alone at rollator level with daily support from son. Patient was mod A for bed mobility with max A to assist with weight shifting to attempt to take steps at EOB with pain too overwhelming patient was returned to bed. Patients son is unable to assist physically or provide 24/7 caregiver support at home. Patient would continue to benefit from skilled OT services at this time while admitted and after d/c to address noted deficits in order to improve overall safety and independence in ADLs.       Recommendations for follow up therapy are one component of a multi-disciplinary discharge planning process, led by the attending physician.  Recommendations may be updated based on patient status, additional functional criteria and insurance authorization.   Follow Up Recommendations  Skilled nursing-short term rehab (<3 hours/day)     Assistance Recommended at Discharge Frequent or constant Supervision/Assistance  Patient can return home with the following Two people to help with walking and/or transfers;Two people to help with bathing/dressing/bathroom;Direct supervision/assist for financial management;Help with stairs or ramp for entrance;Assist for transportation;Direct supervision/assist for medications management;Assistance with cooking/housework    Functional Status Assessment  Patient has had a recent decline in their  functional status and demonstrates the ability to make significant improvements in function in a reasonable and predictable amount of time.  Equipment Recommendations       Recommendations for Other Services       Precautions / Restrictions Precautions Precautions: Fall Restrictions Weight Bearing Restrictions: No Other Position/Activity Restrictions: WBAT per ortho      Mobility Bed Mobility Overal bed mobility: Needs Assistance Bed Mobility: Supine to Sit, Sit to Supine     Supine to sit: Mod assist Sit to supine: Mod assist   General bed mobility comments: patient needed physical A to move BLE to EOB and back into bed with increased pain noted.          Balance Overall balance assessment: Needs assistance Sitting-balance support: No upper extremity supported, Feet supported Sitting balance-Leahy Scale: Good     Standing balance support: Single extremity supported Standing balance-Leahy Scale: Poor Standing balance comment: external and UE assist needed         ADL either performed or assessed with clinical judgement   ADL Overall ADL's : Needs assistance/impaired Eating/Feeding: Set up;Sitting   Grooming: Wash/dry face;Set up;Sitting   Upper Body Bathing: Set up;Sitting   Lower Body Bathing: Total assistance;Bed level Lower Body Bathing Details (indicate cue type and reason): pain in hips impacting participation. nurse called into room to provide pain medications. Upper Body Dressing : Set up;Sitting Upper Body Dressing Details (indicate cue type and reason): cues for safety. Lower Body Dressing: Total assistance;Bed level Lower Body Dressing Details (indicate cue type and reason): unable to move BLE well with increased pain levels. patient asked "wy do my legs hurt" multiple times with son explaining she fell multiple times during session. Toilet Transfer: +2 for physical  assistance;+2 for safety/equipment Toilet Transfer Details (indicate cue type and  reason): patient was unable to advance BLE during standing returned to bed, able to scoot with min A up head of bed. Toileting- Clothing Manipulation and Hygiene: Total assistance;Bed level               Vision   Additional Comments: difficult to assess with patients cogntiive deficits and pain levels            Pertinent Vitals/Pain Pain Assessment Pain Assessment: Faces Faces Pain Scale: Hurts whole lot Pain Location: RLE, right heel Pain Descriptors / Indicators: Grimacing, Guarding, Other (Comment) (asking why her leg hurts) Pain Intervention(s): Limited activity within patient's tolerance, Monitored during session, Repositioned, Patient requesting pain meds-RN notified, RN gave pain meds during session     Hand Dominance Right   Extremity/Trunk Assessment Upper Extremity Assessment Upper Extremity Assessment: Overall WFL for tasks assessed   Lower Extremity Assessment Lower Extremity Assessment: Defer to PT evaluation   Cervical / Trunk Assessment Cervical / Trunk Assessment: Kyphotic   Communication Communication Communication: HOH (has hearing aids in)   Cognition Arousal/Alertness: Awake/alert Behavior During Therapy: WFL for tasks assessed/performed Overall Cognitive Status: History of cognitive impairments - at baseline       General Comments: patient is oriented to self, patient continues to ask when she can go home. son was present and able to provide PLOF.                Home Living Family/patient expects to be discharged to:: Skilled nursing facility Living Arrangements: Children Available Help at Discharge: Family                             Additional Comments: lives with son, assists as needed per previous adm notes      Prior Functioning/Environment Prior Level of Function : Needs assist               ADLs Comments: patient lives at home alone with son who lives 57 feet away and spends time during the day with her.  patient is alone at night.son does IADLs        OT Problem List: Decreased strength;Decreased activity tolerance;Impaired balance (sitting and/or standing);Decreased coordination;Decreased safety awareness;Decreased knowledge of use of DME or AE;Cardiopulmonary status limiting activity;Decreased knowledge of precautions      OT Treatment/Interventions: Self-care/ADL training;Energy conservation;Therapeutic exercise;DME and/or AE instruction;Therapeutic activities;Patient/family education;Balance training    OT Goals(Current goals can be found in the care plan section) Acute Rehab OT Goals Patient Stated Goal: to go home OT Goal Formulation: With family Time For Goal Achievement: 12/28/22 Potential to Achieve Goals: Fair  OT Frequency: Min 2X/week       AM-PAC OT "6 Clicks" Daily Activity     Outcome Measure Help from another person eating meals?: A Little Help from another person taking care of personal grooming?: A Little Help from another person toileting, which includes using toliet, bedpan, or urinal?: A Lot Help from another person bathing (including washing, rinsing, drying)?: A Lot Help from another person to put on and taking off regular upper body clothing?: A Lot Help from another person to put on and taking off regular lower body clothing?: A Lot 6 Click Score: 14   End of Session Equipment Utilized During Treatment: Gait belt;Rolling walker (2 wheels)  Activity Tolerance: Patient limited by pain Patient left: in bed;with call bell/phone within reach;with bed alarm set;with family/visitor present;with  nursing/sitter in room  OT Visit Diagnosis: Unsteadiness on feet (R26.81);Other abnormalities of gait and mobility (R26.89);Muscle weakness (generalized) (M62.81)                Time: 1340-1403 OT Time Calculation (min): 23 min Charges:  OT General Charges $OT Visit: 1 Visit OT Evaluation $OT Eval Moderate Complexity: 1 Mod OT Treatments $Self Care/Home Management :  8-22 mins  Rennie Plowman, MS Acute Rehabilitation Department Office# [phone]   Willa Rough 12/14/2022, 4:12 PM

## 2022-12-14 NOTE — Assessment & Plan Note (Signed)
Continue telmisartan. 

## 2022-12-14 NOTE — Consult Note (Signed)
[facility] Surgery Consult Note  Andrea [name] 1920-10-10  [id number].    Requesting MD: Verneita Griffes Chief Complaint/Reason for Consult: fall  HPI:  Andrea [name] is a 89 y.o. female PMH CHF, severe mitral regurgitation, HTN, chronic right hip dislocation, cognitive impairment/ dementia, osteopenia, code status DNR who was admitted to [facility] yesterday after suffering a ground level fall at home. Patient lives with her son, and ambulates with a walker. She had an unwitnessed fall in the bathroom and her son found her. She was unable to ambulate therefore he brought her to the ED for evaluation. She does not take any blood thinning medications.  ED work up revealed nondisplaced right superior/ inferior pubic rami fractures, nondisplaced S3 fracture with presacral edema/stranding along right pelvic sidewall. CT head and c-spine negative for injury. She did not have a CT scan of her chest or abdomen, but is not complaining of pain here this morning. Patient was admitted to the medical service with an orthopedic consult. Trauma asked to see today.   Family History  Problem Relation Age of Onset   Dementia Mother    Lung cancer Father     Past Medical History:  Diagnosis Date   Chronic diastolic heart failure (HCC)    Heart murmur    Hypertension    Osteoporosis     Past Surgical History:  Procedure Laterality Date   CATARACT EXTRACTION      Social History:  reports that she has never smoked. She has never used smokeless tobacco. She reports that she does not drink alcohol and does not use drugs.  Allergies: No Known Allergies  Medications Prior to Admission  Medication Sig Dispense Refill   Acetaminophen (TYLENOL PO) Take 500 mg by mouth 3 (three) times daily as needed (pain).      Calcium Carbonate-Vit D-Min (CALCIUM 1200 PO) Take 1,200 mg by mouth daily.     furosemide (LASIX) 40 MG tablet Take 40 mg by mouth daily.     Multiple Vitamin (MULTIVITAMIN WITH MINERALS) TABS  Take 1 tablet by mouth daily.     potassium chloride (K-DUR) 10 MEQ tablet Take 2 tablets (20 mEq total) by mouth daily. 30 tablet 0   telmisartan (MICARDIS) 80 MG tablet Take 1 tablet (80 mg total) by mouth daily. 90 tablet 3    Prior to Admission medications   Medication Sig Start Date End Date Taking? Authorizing Provider  Acetaminophen (TYLENOL PO) Take 500 mg by mouth 3 (three) times daily as needed (pain).     [provider]  Calcium Carbonate-Vit D-Min (CALCIUM 1200 PO) Take 1,200 mg by mouth daily.    [provider]  furosemide (LASIX) 40 MG tablet Take 40 mg by mouth daily. 02/01/20   [provider]  Multiple Vitamin (MULTIVITAMIN WITH MINERALS) TABS Take 1 tablet by mouth daily.    [provider]  potassium chloride (K-DUR) 10 MEQ tablet Take 2 tablets (20 mEq total) by mouth daily. 09/24/12   Geradine Girt, DO  telmisartan (MICARDIS) 80 MG tablet Take 1 tablet (80 mg total) by mouth daily. 01/25/17   Jerline Pain, MD    Blood pressure (!) 150/124, pulse 91, temperature 98.1 F (36.7 C), temperature source Oral, resp. rate 16, height '4\' 10"'$  (1.473 m), weight 46.4 kg, SpO2 96 %. Physical Exam: General: pleasant, WD/WN female who is laying in bed in NAD HEENT: head is normocephalic.  Sclera are noninjected.  Pupils equal and round.  Ears and nose without any  masses or lesions.  Mouth is pink and moist. Dentition fair. Small abrasion to right posterior scalp without active bleeding  Heart: regular, rate, and rhythm.  Normal s1,s2. No obvious murmurs, gallops, or rubs noted.  Palpable radial and pedal pulses bilaterally  Lungs: CTAB, no wheezes, rhonchi, or rales noted.  Respiratory effort nonlabored Abd: soft, NT/ND, +BS, no masses, hernias, or organomegaly MS: no BUE/BLE edema, calves soft and nontender. Abrasion to right elbow without active bleeding and ROM intact. Chronic appearing scab to left anterior ankle  Skin: warm and dry with no  masses, lesions, or rashes Neuro: MAEs, no gross motor or sensory deficits BUE/BLE  Results for orders placed or performed during the hospital encounter of 12/13/22 (from the past 48 hour(s))  CBC with Differential     Status: Abnormal   Collection Time: 12/13/22  5:08 PM  Result Value Ref Range   WBC 8.2 4.0 - 10.5 K/uL   RBC 3.61 (L) 3.87 - 5.11 MIL/uL   Hemoglobin 11.4 (L) 12.0 - 15.0 g/dL   HCT 33.8 (L) 36.0 - 46.0 %   MCV 93.6 80.0 - 100.0 fL   MCH 31.6 26.0 - 34.0 pg   MCHC 33.7 30.0 - 36.0 g/dL   RDW 12.6 11.5 - 15.5 %   Platelets 175 150 - 400 K/uL   nRBC 0.0 0.0 - 0.2 %   Neutrophils Relative % 74 %   Neutro Abs 6.1 1.7 - 7.7 K/uL   Lymphocytes Relative 17 %   Lymphs Abs 1.4 0.7 - 4.0 K/uL   Monocytes Relative 7 %   Monocytes Absolute 0.6 0.1 - 1.0 K/uL   Eosinophils Relative 1 %   Eosinophils Absolute 0.1 0.0 - 0.5 K/uL   Basophils Relative 1 %   Basophils Absolute 0.1 0.0 - 0.1 K/uL   Immature Granulocytes 0 %   Abs Immature Granulocytes 0.03 0.00 - 0.07 K/uL    Comment: Performed at [facility], [place] [address], [place], [place] [address]  Comprehensive metabolic panel     Status: Abnormal   Collection Time: 12/13/22  5:08 PM  Result Value Ref Range   Sodium 137 135 - 145 mmol/L   Potassium 3.8 3.5 - 5.1 mmol/L   Chloride 101 98 - 111 mmol/L   CO2 25 22 - 32 mmol/L   Glucose, Bld 102 (H) 70 - 99 mg/dL    Comment: Glucose reference range applies only to samples taken after fasting for at least 8 hours.   BUN 24 (H) 8 - 23 mg/dL   Creatinine, Ser 0.86 0.44 - 1.00 mg/dL   Calcium 8.4 (L) 8.9 - 10.3 mg/dL   Total Protein 6.0 (L) 6.5 - 8.1 g/dL   Albumin 3.0 (L) 3.5 - 5.0 g/dL   AST 17 15 - 41 U/L   ALT 13 0 - 44 U/L   Alkaline Phosphatase 126 38 - 126 U/L   Total Bilirubin 1.0 0.3 - 1.2 mg/dL   GFR, Estimated >60 >60 mL/min    Comment: (NOTE) Calculated using the CKD-EPI Creatinine Equation (2021)    Anion gap 11 5 - 15    Comment:  Performed at [facility], [place] [address], [place], [place] [address]  CK     Status: Abnormal   Collection Time: 12/13/22  5:08 PM  Result Value Ref Range   Total CK 35 (L) 38 - 234 U/L    Comment: Performed at [facility], [place] [address] [place],  [place] [address]  CBC     Status: Abnormal   Collection Time: 12/14/22  3:40 AM  Result Value Ref Range   WBC 6.8 4.0 - 10.5 K/uL   RBC 3.43 (L) 3.87 - 5.11 MIL/uL   Hemoglobin 11.0 (L) 12.0 - 15.0 g/dL   HCT 32.5 (L) 36.0 - 46.0 %   MCV 94.8 80.0 - 100.0 fL   MCH 32.1 26.0 - 34.0 pg   MCHC 33.8 30.0 - 36.0 g/dL   RDW 12.5 11.5 - 15.5 %   Platelets 169 150 - 400 K/uL   nRBC 0.0 0.0 - 0.2 %    Comment: Performed at [facility], [place] [address] [place], [place] [address]  Basic metabolic panel     Status: Abnormal   Collection Time: 12/14/22  3:40 AM  Result Value Ref Range   Sodium 138 135 - 145 mmol/L   Potassium 3.5 3.5 - 5.1 mmol/L   Chloride 106 98 - 111 mmol/L   CO2 25 22 - 32 mmol/L   Glucose, Bld 118 (H) 70 - 99 mg/dL    Comment: Glucose reference range applies only to samples taken after fasting for at least 8 hours.   BUN 24 (H) 8 - 23 mg/dL   Creatinine, Ser 0.76 0.44 - 1.00 mg/dL   Calcium 8.3 (L) 8.9 - 10.3 mg/dL   GFR, Estimated >60 >60 mL/min    Comment: (NOTE) Calculated using the CKD-EPI Creatinine Equation (2021)    Anion gap 7 5 - 15    Comment: Performed at [facility], [place] [address] [place], [place] [address]   CT PELVIS WO CONTRAST  Result Date: 12/13/2022 CLINICAL DATA:  Hip trauma EXAM: CT PELVIS WITHOUT CONTRAST TECHNIQUE: Multidetector CT imaging of the pelvis was performed following the standard protocol without intravenous contrast. RADIATION DOSE REDUCTION: This exam was performed according to the departmental dose-optimization program which includes automated exposure control, adjustment of the mA and/or kV according to  patient size and/or use of iterative reconstruction technique. COMPARISON:  X-ray same day FINDINGS: Urinary Tract:  No abnormality visualized. Bowel:  Unremarkable visualized pelvic bowel loops. Vascular/Lymphatic: There are atherosclerotic calcifications of the aorta and iliac arteries. Reproductive:  Small calcified uterine fibroids are present. Other: There is no free fluid. There is presacral edema and some stranding along the right pelvic sidewall. There is no focal hernia. Musculoskeletal: The bones are diffusely osteopenic. There is nondisplaced fracture along the anterior cortex of the S3 level. There are subtle nondisplaced acute fractures of the right superior and inferior pubic rami. There are chronic changes of the right hip with flattening and remodeling of the right femoral head. There is acetabular remodeling and sclerosis. There is increased joint space and moderate-sized joint effusion. There is fluid distention of the bursa anteriorly. IMPRESSION: 1. Acute nondisplaced fractures of the right superior and inferior pubic rami. 2. Acute nondisplaced fracture of the sacrum at the S3 level. 3. Presacral edema and stranding along the right pelvic sidewall. 4. Chronic changes of the right hip with joint effusion and fluid distention of the bursa. Findings may be related to chronic AVN or infection. Aortic Atherosclerosis (ICD10-I70.0). Electronically Signed   By: Ronney Asters M.D.   On: 12/13/2022 19:30   CT HEAD WO CONTRAST (5MM)  Result Date: 12/13/2022 CLINICAL DATA:  Fall.  Head trauma. EXAM: CT HEAD WITHOUT CONTRAST CT CERVICAL SPINE WITHOUT CONTRAST TECHNIQUE: Multidetector CT imaging of the head and cervical spine was performed following  the standard protocol without intravenous contrast. Multiplanar CT image reconstructions of the cervical spine were also generated. RADIATION DOSE REDUCTION: This exam was performed according to the departmental dose-optimization program which includes  automated exposure control, adjustment of the mA and/or kV according to patient size and/or use of iterative reconstruction technique. COMPARISON:  None Available. FINDINGS: CT HEAD FINDINGS Brain: No evidence of acute infarction, hemorrhage, hydrocephalus, extra-axial collection or mass lesion/mass effect. There is age related ventricular sulcal enlargement. Old right basal ganglia lacunar infarct. Patchy areas of white matter hypoattenuation consistent with mild chronic microvascular ischemic change. Vascular: No hyperdense vessel or unexpected calcification. Skull: Normal. Negative for fracture or focal lesion. Sinuses/Orbits: Globes and orbits are unremarkable. Visualized sinuses are clear. Other: None. CT CERVICAL SPINE FINDINGS Alignment: Grade 1 anterolisthesis of C7 on T1. No other spondylolisthesis. Generalized straightened cervical lordosis. Skull base and vertebrae: No acute fracture. No primary bone lesion or focal pathologic process. Soft tissues and spinal canal: No prevertebral fluid or swelling. No visible canal hematoma. Disc levels: Marked loss of disc height from C2-C3 through C4-C5. Moderate loss of disc height at C5-C6 and C6-C7. Bilateral facet degenerative changes in the mid to upper cervical spine. No convincing disc herniation. Upper chest: No acute findings.  Lung apices are clear. Other: None. IMPRESSION: HEAD CT 1. No acute intracranial abnormalities. CERVICAL CT 1. No fracture or acute finding. Electronically Signed   By: Lajean Manes M.D.   On: 12/13/2022 19:23   CT Cervical Spine Wo Contrast  Result Date: 12/13/2022 CLINICAL DATA:  Fall.  Head trauma. EXAM: CT HEAD WITHOUT CONTRAST CT CERVICAL SPINE WITHOUT CONTRAST TECHNIQUE: Multidetector CT imaging of the head and cervical spine was performed following the standard protocol without intravenous contrast. Multiplanar CT image reconstructions of the cervical spine were also generated. RADIATION DOSE REDUCTION: This exam was  performed according to the departmental dose-optimization program which includes automated exposure control, adjustment of the mA and/or kV according to patient size and/or use of iterative reconstruction technique. COMPARISON:  None Available. FINDINGS: CT HEAD FINDINGS Brain: No evidence of acute infarction, hemorrhage, hydrocephalus, extra-axial collection or mass lesion/mass effect. There is age related ventricular sulcal enlargement. Old right basal ganglia lacunar infarct. Patchy areas of white matter hypoattenuation consistent with mild chronic microvascular ischemic change. Vascular: No hyperdense vessel or unexpected calcification. Skull: Normal. Negative for fracture or focal lesion. Sinuses/Orbits: Globes and orbits are unremarkable. Visualized sinuses are clear. Other: None. CT CERVICAL SPINE FINDINGS Alignment: Grade 1 anterolisthesis of C7 on T1. No other spondylolisthesis. Generalized straightened cervical lordosis. Skull base and vertebrae: No acute fracture. No primary bone lesion or focal pathologic process. Soft tissues and spinal canal: No prevertebral fluid or swelling. No visible canal hematoma. Disc levels: Marked loss of disc height from C2-C3 through C4-C5. Moderate loss of disc height at C5-C6 and C6-C7. Bilateral facet degenerative changes in the mid to upper cervical spine. No convincing disc herniation. Upper chest: No acute findings.  Lung apices are clear. Other: None. IMPRESSION: HEAD CT 1. No acute intracranial abnormalities. CERVICAL CT 1. No fracture or acute finding. Electronically Signed   By: Lajean Manes M.D.   On: 12/13/2022 19:23   DG Elbow Complete Right  Result Date: 12/13/2022 CLINICAL DATA:  Mechanical fall yesterday with pain EXAM: RIGHT ELBOW - COMPLETE 4 VIEW COMPARISON:  None Available. FINDINGS: There is no evidence of fracture, dislocation, or joint effusion. There is no evidence of arthropathy or other focal bone abnormality. Soft tissues are unremarkable.  Osteopenia. IMPRESSION: No acute osseous abnormality.  Osteopenia. Electronically Signed   By: Jill Side M.D.   On: 12/13/2022 17:48   DG Hip Unilat W or Wo Pelvis 2-3 Views Right  Result Date: 12/13/2022 CLINICAL DATA:  Pain after fall EXAM: DG HIP (WITH OR WITHOUT PELVIS) 3V RIGHT COMPARISON:  X-ray 07/22/2014 FINDINGS: There is progressive severe deformity of the right femoral head consistent with progressive collapse please correlate for any history of AVN. There is some bony remodeling of the acetabulum medially with some protrusio. Mild concentric joint space loss of the left hip. No acute fracture or dislocation. However there is severe osteopenia and if there is further concern follow-up cross-sectional imaging may be of benefit to assess for an occult abnormality. Overlapping cardiac leads. Catheter. Rectal stool. IMPRESSION: Progressive deformity and collapse of the right femoral head and bony remodeling of the adjacent acetabulum. Please correlate for any known history of AVN. Degenerative changes of the left hip. Severe osteopenia. No obvious fracture or dislocation although with this level of osteopenia a subtle nondisplaced injury is difficult to completely exclude and if needed additional cross-sectional imaging as clinically directed. Electronically Signed   By: Jill Side M.D.   On: 12/13/2022 17:26   DG Chest 1 View  Result Date: 12/13/2022 CLINICAL DATA:  Mechanical fall yesterday.  Pain EXAM: CHEST  1 VIEW COMPARISON:  Two-view x-ray 2016 January FINDINGS: Underinflation with borderline cardiopericardial silhouette. Mild basilar atelectasis or scar. No consolidation, pneumothorax or effusion. No edema. Overlapping cardiac leads. Advanced degenerative changes of the shoulders. There is also cortical irregularity along the left humeral neck. Please correlate for any history of injury or dedicated shoulder x-rays are recommended when appropriate. IMPRESSION: Borderline size heart.  Mild  basilar atelectasis. Advanced degenerative changes of the shoulders. Cortical irregularity along the humeral neck medially on the left. Please correlate with any known history or dedicated x-ray when appropriate to assess for injury. Electronically Signed   By: Jill Side M.D.   On: 12/13/2022 17:23    Anti-infectives (From admission, onward)    None        Assessment/Plan Fall Nondisplaced right superior/ inferior pubic rami fxs - per orthopedics, Dr. Doran Durand Nondisplaced S3 fx with presacral edema/stranding along right pelvic sidewall - H/h stable and LFTs WNL on admission, abdominal exam benign, no concerns regarding presacral edema. Plan per orthopedics, Dr. Doran Durand Chronic anemia - Hgb stable at 11  - below per TRH - CHF Severe mitral regurgitation HTN Chronic right hip dislocation  Cognitive impairment/ dementia Osteopenia Code status DNR  ID - none VTE - SCDs, sq heparin FEN - D3 diet Foley - none  Dispo - Tertiary survey negative. Recommend multimodal pain control, limit narcotics. PT/OT evaluations pending. Trauma will sign off, please call with questions or concerns.   I reviewed ED provider notes, hospitalist notes, last 24 h vitals and pain scores, last 48 h intake and output, last 24 h labs and trends, and last 24 h imaging results.   Barkley Boards, [facility] Surgery 12/14/2022, 10:21 AM Please see Amion for pager number during day hours 7:00am-4:30pm

## 2022-12-14 NOTE — Progress Notes (Signed)
PROGRESS NOTE   Andrea [name]  QJF:[id number] DOB: November 11, 1920 DOA: 12/13/2022 PCP: Leeroy Cha, MD  Brief Narrative:   89 year old community dwelling white female (lives with son) HTN, prior admission for pneumonia, mild cognitive deficits severe mitral regurgitation followed by cardiology, prior hip dislocation without surgery, chronic HFpEF-baseline ambulatory with a walker Presented to ED with right hip pain after fall at home  Unable to ambulate after fall Admitted for further diagnosis and management  Orthopedics and trauma consulted Patient admitted for pain control  Hospital-Problem based course  Accidental fall -Nonoperative fractures - Therapy recommends skilled - Likely can happen in the next 24 to 48 hours with pain management  HTN -Placed on aVaPro 300 and monitor trends  Severe mitral regurg not repair candidate HFpEF chronic - Hold Lasix for now  DVT prophylaxis: Heparin Code Status: DNR confirmed bedside Family Communication: Discussed with son bedside Disposition:  Status is: Inpatient Remains inpatient appropriate because:   Likely needs skilled     Subjective: Pleasant some cognitive deficit but otherwise not agitated  Objective: Vitals:   12/13/22 2218 12/13/22 2230 12/13/22 2306 12/14/22 0423  BP:  (!) 148/106 (!) 156/81 116/60  Pulse:  87 81 70  Resp:   18 16  Temp: 98.3 F (36.8 C)  98.2 F (36.8 C) 98.1 F (36.7 C)  TempSrc: Oral  Oral Oral  SpO2:  94% 97% 97%  Weight:   46.4 kg   Height:   '4\' 10"'$  (1.473 m)     Intake/Output Summary (Last 24 hours) at 12/14/2022 0739 Last data filed at 12/13/2022 2325 Gross per 24 hour  Intake 60 ml  Output --  Net 60 ml   Filed Weights   12/13/22 2306  Weight: 46.4 kg    Examination:  EOMI NCAT no icterus no pallor Blowing holosystolic murmur at [facility] and at [facility] Abdomen soft no rebound Straight leg raising nontender Full neuroexam deferred  Data Reviewed: personally  reviewed   CBC    Component Value Date/Time   WBC 6.8 12/14/2022 0340   RBC 3.43 (L) 12/14/2022 0340   HGB 11.0 (L) 12/14/2022 0340   HCT 32.5 (L) 12/14/2022 0340   PLT 169 12/14/2022 0340   MCV 94.8 12/14/2022 0340   MCH 32.1 12/14/2022 0340   MCHC 33.8 12/14/2022 0340   RDW 12.5 12/14/2022 0340   LYMPHSABS 1.4 12/13/2022 1708   MONOABS 0.6 12/13/2022 1708   EOSABS 0.1 12/13/2022 1708   BASOSABS 0.1 12/13/2022 1708      Latest Ref Rng & Units 12/14/2022    3:40 AM 12/13/2022    5:08 PM 07/28/2015    2:33 PM  CMP  Glucose 70 - 99 mg/dL 118  102  91   BUN 8 - 23 mg/dL '24  24  22   '$ Creatinine 0.44 - 1.00 mg/dL 0.76  0.86  0.76   Sodium 135 - 145 mmol/L 138  137  139   Potassium 3.5 - 5.1 mmol/L 3.5  3.8  4.0   Chloride 98 - 111 mmol/L 106  101  100   CO2 22 - 32 mmol/L '25  25  30   '$ Calcium 8.9 - 10.3 mg/dL 8.3  8.4  9.2   Total Protein 6.5 - 8.1 g/dL  6.0    Total Bilirubin 0.3 - 1.2 mg/dL  1.0    Alkaline Phos 38 - 126 U/L  126    AST 15 - 41 U/L  17    ALT 0 - 44 U/L  13       Radiology Studies: CT PELVIS WO CONTRAST  Result Date: 12/13/2022 CLINICAL DATA:  Hip trauma EXAM: CT PELVIS WITHOUT CONTRAST TECHNIQUE: Multidetector CT imaging of the pelvis was performed following the standard protocol without intravenous contrast. RADIATION DOSE REDUCTION: This exam was performed according to the departmental dose-optimization program which includes automated exposure control, adjustment of the mA and/or kV according to patient size and/or use of iterative reconstruction technique. COMPARISON:  X-ray same day FINDINGS: Urinary Tract:  No abnormality visualized. Bowel:  Unremarkable visualized pelvic bowel loops. Vascular/Lymphatic: There are atherosclerotic calcifications of the aorta and iliac arteries. Reproductive:  Small calcified uterine fibroids are present. Other: There is no free fluid. There is presacral edema and some stranding along the right pelvic sidewall. There is no  focal hernia. Musculoskeletal: The bones are diffusely osteopenic. There is nondisplaced fracture along the anterior cortex of the S3 level. There are subtle nondisplaced acute fractures of the right superior and inferior pubic rami. There are chronic changes of the right hip with flattening and remodeling of the right femoral head. There is acetabular remodeling and sclerosis. There is increased joint space and moderate-sized joint effusion. There is fluid distention of the bursa anteriorly. IMPRESSION: 1. Acute nondisplaced fractures of the right superior and inferior pubic rami. 2. Acute nondisplaced fracture of the sacrum at the S3 level. 3. Presacral edema and stranding along the right pelvic sidewall. 4. Chronic changes of the right hip with joint effusion and fluid distention of the bursa. Findings may be related to chronic AVN or infection. Aortic Atherosclerosis (ICD10-I70.0). Electronically Signed   By: Ronney Asters M.D.   On: 12/13/2022 19:30   CT HEAD WO CONTRAST (5MM)  Result Date: 12/13/2022 CLINICAL DATA:  Fall.  Head trauma. EXAM: CT HEAD WITHOUT CONTRAST CT CERVICAL SPINE WITHOUT CONTRAST TECHNIQUE: Multidetector CT imaging of the head and cervical spine was performed following the standard protocol without intravenous contrast. Multiplanar CT image reconstructions of the cervical spine were also generated. RADIATION DOSE REDUCTION: This exam was performed according to the departmental dose-optimization program which includes automated exposure control, adjustment of the mA and/or kV according to patient size and/or use of iterative reconstruction technique. COMPARISON:  None Available. FINDINGS: CT HEAD FINDINGS Brain: No evidence of acute infarction, hemorrhage, hydrocephalus, extra-axial collection or mass lesion/mass effect. There is age related ventricular sulcal enlargement. Old right basal ganglia lacunar infarct. Patchy areas of white matter hypoattenuation consistent with mild chronic  microvascular ischemic change. Vascular: No hyperdense vessel or unexpected calcification. Skull: Normal. Negative for fracture or focal lesion. Sinuses/Orbits: Globes and orbits are unremarkable. Visualized sinuses are clear. Other: None. CT CERVICAL SPINE FINDINGS Alignment: Grade 1 anterolisthesis of C7 on T1. No other spondylolisthesis. Generalized straightened cervical lordosis. Skull base and vertebrae: No acute fracture. No primary bone lesion or focal pathologic process. Soft tissues and spinal canal: No prevertebral fluid or swelling. No visible canal hematoma. Disc levels: Marked loss of disc height from C2-C3 through C4-C5. Moderate loss of disc height at C5-C6 and C6-C7. Bilateral facet degenerative changes in the mid to upper cervical spine. No convincing disc herniation. Upper chest: No acute findings.  Lung apices are clear. Other: None. IMPRESSION: HEAD CT 1. No acute intracranial abnormalities. CERVICAL CT 1. No fracture or acute finding. Electronically Signed   By: Lajean Manes M.D.   On: 12/13/2022 19:23   CT Cervical Spine Wo Contrast  Result Date: 12/13/2022 CLINICAL DATA:  Fall.  Head trauma. EXAM: CT  HEAD WITHOUT CONTRAST CT CERVICAL SPINE WITHOUT CONTRAST TECHNIQUE: Multidetector CT imaging of the head and cervical spine was performed following the standard protocol without intravenous contrast. Multiplanar CT image reconstructions of the cervical spine were also generated. RADIATION DOSE REDUCTION: This exam was performed according to the departmental dose-optimization program which includes automated exposure control, adjustment of the mA and/or kV according to patient size and/or use of iterative reconstruction technique. COMPARISON:  None Available. FINDINGS: CT HEAD FINDINGS Brain: No evidence of acute infarction, hemorrhage, hydrocephalus, extra-axial collection or mass lesion/mass effect. There is age related ventricular sulcal enlargement. Old right basal ganglia lacunar infarct.  Patchy areas of white matter hypoattenuation consistent with mild chronic microvascular ischemic change. Vascular: No hyperdense vessel or unexpected calcification. Skull: Normal. Negative for fracture or focal lesion. Sinuses/Orbits: Globes and orbits are unremarkable. Visualized sinuses are clear. Other: None. CT CERVICAL SPINE FINDINGS Alignment: Grade 1 anterolisthesis of C7 on T1. No other spondylolisthesis. Generalized straightened cervical lordosis. Skull base and vertebrae: No acute fracture. No primary bone lesion or focal pathologic process. Soft tissues and spinal canal: No prevertebral fluid or swelling. No visible canal hematoma. Disc levels: Marked loss of disc height from C2-C3 through C4-C5. Moderate loss of disc height at C5-C6 and C6-C7. Bilateral facet degenerative changes in the mid to upper cervical spine. No convincing disc herniation. Upper chest: No acute findings.  Lung apices are clear. Other: None. IMPRESSION: HEAD CT 1. No acute intracranial abnormalities. CERVICAL CT 1. No fracture or acute finding. Electronically Signed   By: Lajean Manes M.D.   On: 12/13/2022 19:23   DG Elbow Complete Right  Result Date: 12/13/2022 CLINICAL DATA:  Mechanical fall yesterday with pain EXAM: RIGHT ELBOW - COMPLETE 4 VIEW COMPARISON:  None Available. FINDINGS: There is no evidence of fracture, dislocation, or joint effusion. There is no evidence of arthropathy or other focal bone abnormality. Soft tissues are unremarkable. Osteopenia. IMPRESSION: No acute osseous abnormality.  Osteopenia. Electronically Signed   By: Jill Side M.D.   On: 12/13/2022 17:48   DG Hip Unilat W or Wo Pelvis 2-3 Views Right  Result Date: 12/13/2022 CLINICAL DATA:  Pain after fall EXAM: DG HIP (WITH OR WITHOUT PELVIS) 3V RIGHT COMPARISON:  X-ray 07/22/2014 FINDINGS: There is progressive severe deformity of the right femoral head consistent with progressive collapse please correlate for any history of AVN. There is some  bony remodeling of the acetabulum medially with some protrusio. Mild concentric joint space loss of the left hip. No acute fracture or dislocation. However there is severe osteopenia and if there is further concern follow-up cross-sectional imaging may be of benefit to assess for an occult abnormality. Overlapping cardiac leads. Catheter. Rectal stool. IMPRESSION: Progressive deformity and collapse of the right femoral head and bony remodeling of the adjacent acetabulum. Please correlate for any known history of AVN. Degenerative changes of the left hip. Severe osteopenia. No obvious fracture or dislocation although with this level of osteopenia a subtle nondisplaced injury is difficult to completely exclude and if needed additional cross-sectional imaging as clinically directed. Electronically Signed   By: Jill Side M.D.   On: 12/13/2022 17:26   DG Chest 1 View  Result Date: 12/13/2022 CLINICAL DATA:  Mechanical fall yesterday.  Pain EXAM: CHEST  1 VIEW COMPARISON:  Two-view x-ray 2016 January FINDINGS: Underinflation with borderline cardiopericardial silhouette. Mild basilar atelectasis or scar. No consolidation, pneumothorax or effusion. No edema. Overlapping cardiac leads. Advanced degenerative changes of the shoulders. There is also cortical  irregularity along the left humeral neck. Please correlate for any history of injury or dedicated shoulder x-rays are recommended when appropriate. IMPRESSION: Borderline size heart.  Mild basilar atelectasis. Advanced degenerative changes of the shoulders. Cortical irregularity along the humeral neck medially on the left. Please correlate with any known history or dedicated x-ray when appropriate to assess for injury. Electronically Signed   By: Jill Side M.D.   On: 12/13/2022 17:23     Scheduled Meds:  heparin  5,000 Units Subcutaneous Q8H   irbesartan  300 mg Oral Daily   Continuous Infusions:   LOS: 1 day   Time spent: 25  Nita Sells,  MD [facility] To contact the attending provider between 7A-7P or the covering provider during after hours 7P-7A, please log into the web site [email or web address] and access using universal [place] password for that web site. If you do not have the password, please call the hospital operator.  12/14/2022, 7:39 AM

## 2022-12-14 NOTE — Hospital Course (Signed)
Andrea [name] is a 89 y.o. female with medical history significant for chronic diastolic CHF, severe mitral regurgitation, HTN, chronic right hip dislocation ambulatory with a walker who is admitted with acute nondisplaced fractures of the right superior and inferior pubic rami and sacrum at the level of S3 after a fall at home.

## 2022-12-14 NOTE — Assessment & Plan Note (Signed)
Severe mitral regurgitation Appears slightly hypovolemic on admission.  Hold Lasix for now.

## 2022-12-15 DIAGNOSIS — S3210XA Unspecified fracture of sacrum, initial encounter for closed fracture: Secondary | ICD-10-CM | POA: Diagnosis not present

## 2022-12-15 DIAGNOSIS — I10 Essential (primary) hypertension: Secondary | ICD-10-CM

## 2022-12-15 DIAGNOSIS — I5032 Chronic diastolic (congestive) heart failure: Secondary | ICD-10-CM

## 2022-12-15 DIAGNOSIS — S32591A Other specified fracture of right pubis, initial encounter for closed fracture: Secondary | ICD-10-CM | POA: Diagnosis not present

## 2022-12-15 DIAGNOSIS — I340 Nonrheumatic mitral (valve) insufficiency: Secondary | ICD-10-CM

## 2022-12-15 DIAGNOSIS — F03918 Unspecified dementia, unspecified severity, with other behavioral disturbance: Secondary | ICD-10-CM | POA: Diagnosis not present

## 2022-12-15 MED ORDER — MELATONIN 5 MG PO TABS
10.0000 mg | ORAL_TABLET | Freq: Every evening | ORAL | Status: DC | PRN
  Administered 2022-12-15: 10 mg via ORAL
  Filled 2022-12-15: qty 2

## 2022-12-15 MED ORDER — ACETAMINOPHEN 325 MG PO TABS
650.0000 mg | ORAL_TABLET | Freq: Two times a day (BID) | ORAL | Status: DC
  Administered 2022-12-15 – 2022-12-16 (×3): 650 mg via ORAL
  Filled 2022-12-15 (×3): qty 2

## 2022-12-15 MED ORDER — ADULT MULTIVITAMIN W/MINERALS CH
1.0000 | ORAL_TABLET | Freq: Every day | ORAL | Status: DC
  Administered 2022-12-16: 1 via ORAL
  Filled 2022-12-15: qty 1

## 2022-12-15 MED ORDER — ENSURE ENLIVE PO LIQD
237.0000 mL | Freq: Two times a day (BID) | ORAL | Status: DC
  Administered 2022-12-15 – 2022-12-16 (×3): 237 mL via ORAL

## 2022-12-15 MED ORDER — HYDRALAZINE HCL 20 MG/ML IJ SOLN: 10.0000 mg | Freq: Four times a day (QID) | INTRAMUSCULAR | Status: DC | PRN

## 2022-12-15 NOTE — Plan of Care (Signed)

## 2022-12-15 NOTE — Assessment & Plan Note (Addendum)
·   Please see assessment and plan above °

## 2022-12-15 NOTE — TOC Initial Note (Signed)
Transition of Care [facility]) - Initial/Assessment Note   Patient Details  Name: Andrea [name] MRN: [id number] Date of Birth: 26-May-1920  Transition of [facility]) CM/SW Contact:    Sherie Don, LCSW Phone Number: 12/15/2022, 1:24 PM  Clinical Narrative: PT evaluation recommended SNF. Patient is oriented to self only, so CSW followed up with son, Kelsie Zaborowski, regarding recommendations for SNF. Son is in agreement about rehab and asked about LTC. CSW explained that TOC does not assist with LTC and is private pay. Son verbalized understanding.  FL2 done; PASRR received. Initial referral faxed out. TOC awaiting bed offers.  Expected Discharge Plan: Skilled Nursing Facility Barriers to Discharge: Continued Medical Work up  Patient Goals and CMS Choice Patient states their goals for this hospitalization and ongoing recovery are:: Go to short-term rehab CMS Medicare.gov Compare Post Acute Care list provided to:: Patient Represenative (must comment) Choice offered to / list presented to : Adult Children  Expected Discharge Plan and Services In-house Referral: Clinical Social Work Post Acute Care Choice: [place] Living arrangements for the past 2 months: [place]           DME Arranged: N/A DME Agency: NA  Prior Living Arrangements/Services Living arrangements for the past 2 months: Single Family Home Lives with:: Adult Children Patient language and need for interpreter reviewed:: Yes Do you feel safe going back to the place where you live?: Yes      Need for Family Participation in Patient Care: Yes (Comment) (Patient is oriented to self only.) Care giver support system in place?: Yes (comment) Criminal Activity/Legal Involvement Pertinent to Current Situation/Hospitalization: No - Comment as needed  Activities of Daily Living Home Assistive Devices/Equipment: Walker (specify type) ADL Screening (condition at time of admission) Patient's cognitive ability  adequate to safely complete daily activities?: Yes Is the patient deaf or have difficulty hearing?: No Does the patient have difficulty seeing, even when wearing glasses/contacts?: Yes Does the patient have difficulty concentrating, remembering, or making decisions?: Yes Patient able to express need for assistance with ADLs?: Yes Does the patient have difficulty dressing or bathing?: Yes Independently performs ADLs?: No Communication: Needs assistance Is this a change from baseline?: Pre-admission baseline Dressing (OT): Needs assistance Is this a change from baseline?: Pre-admission baseline Grooming: Needs assistance Is this a change from baseline?: Pre-admission baseline Feeding: Needs assistance Is this a change from baseline?: Pre-admission baseline Bathing: Needs assistance Is this a change from baseline?: Pre-admission baseline Toileting: Needs assistance Is this a change from baseline?: Pre-admission baseline In/Out Bed: Needs assistance Is this a change from baseline?: Pre-admission baseline Walks in Home: Needs assistance, Independent with device (comment) Is this a change from baseline?: Pre-admission baseline Does the patient have difficulty walking or climbing stairs?: Yes Weakness of Legs: Both Weakness of Arms/Hands: None  Permission Sought/Granted Permission sought to share information with : Chartered certified accountant granted to share information with : Yes, Verbal Permission Granted Permission granted to share info w AGENCY: SNFs  Emotional Assessment Orientation: : Oriented to Self Alcohol / Substance Use: Not Applicable Psych Involvement: No (comment)  Admission diagnosis:  Closed fracture of multiple pubic rami, right, initial encounter ([place]) [S32.591A] Closed displaced fracture of pelvis, unspecified part of pelvis, initial encounter ([place]) [S32.9XXA] Closed fracture of sacrum, unspecified portion of sacrum, initial encounter [facility])  [S32.10XA] Patient Active Problem List   Diagnosis Date Noted   Cognitive impairment 12/14/2022   Closed fracture of multiple pubic rami, right, initial encounter ([place]) 12/13/2022  Closed sacral fracture ([place]) 12/13/2022   Severe mitral regurgitation 12/25/2013   Chronic diastolic heart failure ([place]) 12/25/2013   Essential hypertension 12/25/2013   Hypokalemia 09/22/2012   Edema 09/22/2012   Acute respiratory failure ([place]) 09/21/2012   HTN (hypertension) 09/21/2012   PCP:  Leeroy Cha, MD Pharmacy:   [id number] [place], [id number]: [phone]: [phone] Social Determinants of Health (SDOH) Social History: SDOH Screenings   Tobacco Use: Low Risk  (12/13/2022)   SDOH Interventions:    Readmission Risk Interventions     No data to display

## 2022-12-15 NOTE — [place] FL2 (Signed)
[place] MEDICAID FL2 LEVEL OF CARE FORM     IDENTIFICATION  Patient Name: Andrea [name] Birthdate: 1920/01/01 Sex: female Admission Date (Current Location): 12/13/2022  [facility] and [place] Number:  Herbalist and Address:  [facility] [place],  [place], [place]      Provider Number: [id number]  Attending Physician Name and Address:  Vernelle Emerald, MD  Relative Name and Phone Number:  Rainee Sweatt (son) Ph: [phone]    Current Level of Care: Hospital Recommended Level of Care: [place] Prior Approval Number:    Date Approved/Denied:   PASRR Number: [id number]  Discharge Plan: SNF    Current Diagnoses: Patient Active Problem List   Diagnosis Date Noted   Cognitive impairment 12/14/2022   Closed fracture of multiple pubic rami, right, initial encounter ([place]) 12/13/2022   Closed sacral fracture ([place]) 12/13/2022   Severe mitral regurgitation 12/25/2013   Chronic diastolic heart failure ([place]) 12/25/2013   Essential hypertension 12/25/2013   Hypokalemia 09/22/2012   Edema 09/22/2012   Acute respiratory failure ([place]) 09/21/2012   HTN (hypertension) 09/21/2012    Orientation RESPIRATION BLADDER Height & Weight     Self  Normal Continent Weight: 102 lb 4.7 oz (46.4 kg) Height:  '4\' 10"'$  (147.3 cm)  BEHAVIORAL SYMPTOMS/MOOD NEUROLOGICAL BOWEL NUTRITION STATUS   (N/A)  (N/A) Continent Diet (Dysphagia 3 diet)  AMBULATORY STATUS COMMUNICATION OF NEEDS Skin   Extensive Assist Verbally Skin abrasions (Abrasion: right arm, left leg, head)                       Personal Care Assistance Level of Assistance  Bathing, Feeding, Dressing Bathing Assistance: Maximum assistance Feeding assistance: Independent Dressing Assistance: Maximum assistance     Functional Limitations Info  Sight, Hearing, Speech Sight Info: Impaired Hearing Info: Impaired Speech Info: Adequate    SPECIAL CARE FACTORS FREQUENCY  PT  (By licensed PT), OT (By licensed OT)     PT Frequency: 5x's/week OT Frequency: 5x's/week            Contractures Contractures Info: Not present    Additional Factors Info  Code Status, Allergies Code Status Info: DNR Allergies Info: NKA           Current Medications (12/15/2022):  This is the current hospital active medication list Current Facility-Administered Medications  Medication Dose Route Frequency Provider Last Rate Last Admin   acetaminophen (TYLENOL) tablet 650 mg  650 mg Oral Q6H PRN Lenore Cordia, MD   650 mg at 12/15/22 0539   Or   acetaminophen (TYLENOL) suppository 650 mg  650 mg Rectal Q6H PRN Lenore Cordia, MD       acetaminophen (TYLENOL) tablet 650 mg  650 mg Oral BID Vernelle Emerald, MD   650 mg at 12/15/22 1159   heparin injection 5,000 Units  5,000 Units Subcutaneous Q8H Zada Finders R, MD   5,000 Units at 12/15/22 0539   irbesartan (AVAPRO) tablet 300 mg  300 mg Oral Daily Zada Finders R, MD   300 mg at 12/15/22 0933   morphine (PF) 2 MG/ML injection 1 mg  1 mg Intravenous Q4H PRN Lenore Cordia, MD       ondansetron (ZOFRAN) tablet 4 mg  4 mg Oral Q6H PRN Lenore Cordia, MD       Or   ondansetron (ZOFRAN) injection 4 mg  4 mg Intravenous Q6H PRN Lenore Cordia, MD  oxyCODONE (Oxy IR/ROXICODONE) immediate release tablet 5 mg  5 mg Oral Q4H PRN Lenore Cordia, MD       senna-docusate (Senokot-S) tablet 1 tablet  1 tablet Oral QHS PRN Lenore Cordia, MD         Discharge Medications: Please see discharge summary for a list of discharge medications.  Relevant Imaging Results:  Relevant Lab Results:   Additional Information SSN: [id number]  Sherie Don, LCSW

## 2022-12-15 NOTE — Assessment & Plan Note (Addendum)
Likely underlying dementia with bouts of agitation and confusion throughout the day Minimizing uncomfortable stimuli Minimizing mood altering and sedating agents Encouraging family to remain at bedside is much as possible Frequent redirection by staff Fall precautions

## 2022-12-15 NOTE — Progress Notes (Signed)
Initial Nutrition Assessment  DOCUMENTATION CODES:   Not applicable  INTERVENTION:  - DYS 3 diet as medically appropriate.  - Patient receiving automatic house trays to ensure she receives 3 meals a day given confusion.  - Ensure Plus High Protein po BID, each supplement provides 350 kcal and 20 grams of protein. - Magic cup BID with meals, each supplement provides 290 kcal and 9 grams of protein. - Daily multivitamin to support micronutrient needs. - Monitor weight trends.   NUTRITION DIAGNOSIS:   Increased nutrient needs related to acute illness as evidenced by estimated needs  GOAL:   Patient will meet greater than or equal to 90% of their needs  MONITOR:   PO intake, Weight trends, Supplement acceptance  REASON FOR ASSESSMENT:   Malnutrition Screening Tool    ASSESSMENT:   89 y.o. female with PMH significant for chronic diastolic CHF, severe mitral regurgitation, HTN, chronic right hip dislocation ambulatory with a walker who presented to the ED for evaluation of right hip pain after a fall at home. Found to have non-operative pelvic fractures. Noted to have some cognitive defects.   Met with patient at bedside however patient noted to be very confused and very hard of hearing. Did not respond appropriately to questions. No family at bedside. Unable to obtain nutrition history.  Per chart review, no weight history since March 2021 to assess recent changes in weight. Patient was documented to have had 100% of breakfast yesterday but 0% of breakfast and lunch today. Will order nutrition supplements to support intake.  Medications reviewed and include: -  Labs reviewed:  -   NUTRITION - FOCUSED PHYSICAL EXAM:  Patient very confused and disoriented, exam deferred  Diet Order:   Diet Order             DIET DYS 3 Room service appropriate? Yes; Fluid consistency: Thin  Diet effective now                   EDUCATION NEEDS:  Not appropriate for education at  this time  Skin:  Skin Assessment: Skin Integrity Issues: Skin Integrity Issues:: Other (Comment) Other: skin tear R elbow  Last BM:  1/9  Height:  Ht Readings from Last 1 Encounters:  12/13/22 '4\' 10"'$  (1.473 m)   Weight:  Wt Readings from Last 1 Encounters:  12/13/22 46.4 kg    BMI:  Body mass index is 21.38 kg/m.  Estimated Nutritional Needs:  Kcal:  6767-2094 kcals Protein:  55-70 grams Fluid:  >/= 1.4L    Samson Frederic RD, LDN For contact information, refer to [facility].

## 2022-12-15 NOTE — Progress Notes (Signed)
PROGRESS NOTE   Andrea [name]  NAT:[id number] DOB: Sep 05, 1920 DOA: 12/13/2022 PCP: Leeroy Cha, MD   Date of Service: the patient was seen and examined on 12/15/2022  Brief Narrative:  89 y.o. female with medical history significant for chronic diastolic CHF, severe mitral regurgitation, HTN, chronic right hip dislocation ambulatory with a walker who presented to [facility] emergency department with right hip pain after falling at home.  Patient is a known poor historian with cognitive impairment and lives with her son.  Upon evaluation in the emergency department with CT imaging of the pelvis acute nondisplaced fractures of the right superior and inferior pubic rami were identified in addition to acute nondisplaced fracture of the sacrum at the S3 level with presacral edema and stranding.  Due to on going pain secondary to this the hospitalist group was then called to assess the patient for admission the hospital.  Dr. Doran Durand with orthopedic surgery was consulted and conservative management was recommended with outpatient orthopedic surgery follow-up.  Patient was evaluated by physical therapy and Occupational Therapy with recommendation that that patient would benefit from skilled physical therapy services in a skilled nursing facility.   Assessment and Plan: * Closed fracture of multiple pubic rami, right, initial encounter ([place]) CT imaging revealing acute nondisplaced fractures of the right superior and inferior pubic rami CT additionally identifying acute nondisplaced fracture of the sacrum at the S3 level Patient evaluated by orthopedic surgery.  They are recommending nonsurgical conservative management with outpatient follow-up with Dr. Doran Durand in 3 weeks. PT/OT evaluations Skilled nursing facility placement for skilled physical therapy As needed analgesics with nonopiate-based analgesics -scheduled Tylenol twice daily Obtaining vitamin D level  Closed sacral  fracture (HCC) Please see assessment and plan above  Dementia with behavioral disturbance ([place]) Likely underlying dementia with bouts of agitation and confusion throughout the day Minimizing uncomfortable stimuli Minimizing mood altering and sedating agents Encouraging family to remain at bedside is much as possible Frequent redirection by staff Fall precautions  Chronic diastolic heart failure ([place]) Known history of severe mitral regurgitation No clinical evidence of cardiogenic volume overload Strict input and output monitoring Daily weights Low-sodium diet   Essential hypertension Continue telmisartan or irbesartan formulary equivalent As needed intravenous antihypertensives for markedly elevated blood pressure Relaxed blood pressure goals considering advanced age    Subjective:  Patient is unable to answer questions appropriately due to confusion  Physical Exam:  Vitals:   12/14/22 0933 12/14/22 2200 12/15/22 0540 12/15/22 1412  BP: (!) 150/124 130/81 (!) 122/55 137/72  Pulse: 91 88 65 69  Resp: '16 17 16 17  '$ Temp:  98.3 F (36.8 C) (!) 97.4 F (36.3 C) (!) 97.5 F (36.4 C)  TempSrc:  Oral Oral Oral  SpO2: 96% 97% 97% 100%  Weight:      Height:        Constitutional: Awake and disoriented.  Patient is currently agitated.   Skin: no rashes, no lesions, poor skin turgor noted. Eyes: Pupils are equally reactive to light.  No evidence of scleral icterus or conjunctival pallor.  ENMT: Moist mucous membranes noted.  Posterior pharynx clear of any exudate or lesions.   Respiratory: clear to auscultation bilaterally, no wheezing, no crackles. Normal respiratory effort. No accessory muscle use.  Cardiovascular: 3 out of 6 systolic murmur noted.  Regular rate and rhythm.  No extremity edema. 2+ pedal pulses. No carotid bruits.  Abdomen: Abdomen is soft and nontender.  No evidence of intra-abdominal masses.  Positive bowel sounds  noted in all quadrants.    Musculoskeletal: Pain with both passive and active range of motion of the bilateral lower extremities.  no contractures.  Poor muscle tone.    Data Reviewed:  I have personally reviewed and interpreted labs, imaging.  Significant findings are   CBC: Recent Labs  Lab 12/13/22 1708 12/14/22 0340  WBC 8.2 6.8  NEUTROABS 6.1  --   HGB 11.4* 11.0*  HCT 33.8* 32.5*  MCV 93.6 94.8  PLT 175 644   Basic Metabolic Panel: Recent Labs  Lab 12/13/22 1708 12/14/22 0340  NA 137 138  K 3.8 3.5  CL 101 106  CO2 25 25  GLUCOSE 102* 118*  BUN 24* 24*  CREATININE 0.86 0.76  CALCIUM 8.4* 8.3*   GFR: Estimated Creatinine Clearance: 24.1 mL/min (by C-G formula based on SCr of 0.76 mg/dL). Liver Function Tests: Recent Labs  Lab 12/13/22 1708  AST 17  ALT 13  ALKPHOS 126  BILITOT 1.0  PROT 6.0*  ALBUMIN 3.0*      Code Status:  DNR.    Severity of Illness:  The appropriate patient status for this patient is INPATIENT. Inpatient status is judged to be reasonable and necessary in order to provide the required intensity of service to ensure the patient's safety. The patient's presenting symptoms, physical exam findings, and initial radiographic and laboratory data in the context of their chronic comorbidities is felt to place them at high risk for further clinical deterioration. Furthermore, it is not anticipated that the patient will be medically stable for discharge from the hospital within 2 midnights of admission.   * I certify that at the point of admission it is my clinical judgment that the patient will require inpatient hospital care spanning beyond 2 midnights from the point of admission due to high intensity of service, high risk for further deterioration and high frequency of surveillance required.*  Time spent:  53 minutes  Author:  Vernelle Emerald MD  12/15/2022 9:15 PM

## 2022-12-16 DIAGNOSIS — M6281 Muscle weakness (generalized): Secondary | ICD-10-CM | POA: Diagnosis not present

## 2022-12-16 DIAGNOSIS — I340 Nonrheumatic mitral (valve) insufficiency: Secondary | ICD-10-CM | POA: Diagnosis not present

## 2022-12-16 DIAGNOSIS — R4182 Altered mental status, unspecified: Secondary | ICD-10-CM | POA: Diagnosis not present

## 2022-12-16 DIAGNOSIS — S32591A Other specified fracture of right pubis, initial encounter for closed fracture: Secondary | ICD-10-CM | POA: Diagnosis not present

## 2022-12-16 DIAGNOSIS — F03918 Unspecified dementia, unspecified severity, with other behavioral disturbance: Secondary | ICD-10-CM | POA: Diagnosis not present

## 2022-12-16 DIAGNOSIS — S3210XD Unspecified fracture of sacrum, subsequent encounter for fracture with routine healing: Secondary | ICD-10-CM | POA: Diagnosis not present

## 2022-12-16 DIAGNOSIS — L03116 Cellulitis of left lower limb: Secondary | ICD-10-CM | POA: Diagnosis not present

## 2022-12-16 DIAGNOSIS — R1310 Dysphagia, unspecified: Secondary | ICD-10-CM | POA: Diagnosis not present

## 2022-12-16 DIAGNOSIS — S3210XA Unspecified fracture of sacrum, initial encounter for closed fracture: Secondary | ICD-10-CM | POA: Diagnosis not present

## 2022-12-16 DIAGNOSIS — S32591D Other specified fracture of right pubis, subsequent encounter for fracture with routine healing: Secondary | ICD-10-CM | POA: Diagnosis not present

## 2022-12-16 DIAGNOSIS — K5900 Constipation, unspecified: Secondary | ICD-10-CM | POA: Diagnosis not present

## 2022-12-16 DIAGNOSIS — I10 Essential (primary) hypertension: Secondary | ICD-10-CM | POA: Diagnosis not present

## 2022-12-16 DIAGNOSIS — R011 Cardiac murmur, unspecified: Secondary | ICD-10-CM | POA: Diagnosis not present

## 2022-12-16 DIAGNOSIS — R2689 Other abnormalities of gait and mobility: Secondary | ICD-10-CM | POA: Diagnosis not present

## 2022-12-16 DIAGNOSIS — Y92002 Bathroom of unspecified non-institutional (private) residence single-family (private) house as the place of occurrence of the external cause: Secondary | ICD-10-CM | POA: Diagnosis not present

## 2022-12-16 DIAGNOSIS — I5032 Chronic diastolic (congestive) heart failure: Secondary | ICD-10-CM | POA: Diagnosis not present

## 2022-12-16 DIAGNOSIS — M810 Age-related osteoporosis without current pathological fracture: Secondary | ICD-10-CM | POA: Diagnosis not present

## 2022-12-16 DIAGNOSIS — Z23 Encounter for immunization: Secondary | ICD-10-CM | POA: Diagnosis not present

## 2022-12-16 DIAGNOSIS — L0390 Cellulitis, unspecified: Secondary | ICD-10-CM | POA: Diagnosis not present

## 2022-12-16 DIAGNOSIS — Z7401 Bed confinement status: Secondary | ICD-10-CM | POA: Diagnosis not present

## 2022-12-16 DIAGNOSIS — Z9181 History of falling: Secondary | ICD-10-CM | POA: Diagnosis not present

## 2022-12-16 DIAGNOSIS — M84454D Pathological fracture, pelvis, subsequent encounter for fracture with routine healing: Secondary | ICD-10-CM | POA: Diagnosis not present

## 2022-12-16 DIAGNOSIS — R41841 Cognitive communication deficit: Secondary | ICD-10-CM | POA: Diagnosis not present

## 2022-12-16 DIAGNOSIS — I509 Heart failure, unspecified: Secondary | ICD-10-CM | POA: Diagnosis not present

## 2022-12-16 DIAGNOSIS — R2681 Unsteadiness on feet: Secondary | ICD-10-CM | POA: Diagnosis not present

## 2022-12-16 DIAGNOSIS — W19XXXA Unspecified fall, initial encounter: Secondary | ICD-10-CM | POA: Diagnosis not present

## 2022-12-16 LAB — COMPREHENSIVE METABOLIC PANEL
ALT: 11 U/L (ref 0–44)
AST: 15 U/L (ref 15–41)
Albumin: 2.8 g/dL — ABNORMAL LOW (ref 3.5–5.0)
Alkaline Phosphatase: 108 U/L (ref 38–126)
Anion gap: 8 (ref 5–15)
BUN: 24 mg/dL — ABNORMAL HIGH (ref 8–23)
CO2: 25 mmol/L (ref 22–32)
Calcium: 8.4 mg/dL — ABNORMAL LOW (ref 8.9–10.3)
Chloride: 106 mmol/L (ref 98–111)
Creatinine, Ser: 0.74 mg/dL (ref 0.44–1.00)
GFR, Estimated: >60 mL/min (ref >60)
Glucose, Bld: 111 mg/dL — ABNORMAL HIGH (ref 70–99)
Potassium: 3.6 mmol/L (ref 3.5–5.1)
Sodium: 139 mmol/L (ref 135–145)
Total Bilirubin: 0.9 mg/dL (ref 0.3–1.2)
Total Protein: 5.2 g/dL — ABNORMAL LOW (ref 6.5–8.1)

## 2022-12-16 LAB — CBC WITH DIFFERENTIAL/PLATELET
Abs Immature Granulocytes: 0.02 10*3/uL (ref 0.00–0.07)
Basophils Absolute: 0 10*3/uL (ref 0.0–0.1)
Basophils Relative: 1 %
Eosinophils Absolute: 0.1 10*3/uL (ref 0.0–0.5)
Eosinophils Relative: 3 %
HCT: 28.7 % — ABNORMAL LOW (ref 36.0–46.0)
Hemoglobin: 9.7 g/dL — ABNORMAL LOW (ref 12.0–15.0)
Immature Granulocytes: 0 %
Lymphocytes Relative: 29 %
Lymphs Abs: 1.6 10*3/uL (ref 0.7–4.0)
MCH: 32 pg (ref 26.0–34.0)
MCHC: 33.8 g/dL (ref 30.0–36.0)
MCV: 94.7 fL (ref 80.0–100.0)
Monocytes Absolute: 0.4 10*3/uL (ref 0.1–1.0)
Monocytes Relative: 7 %
Neutro Abs: 3.4 10*3/uL (ref 1.7–7.7)
Neutrophils Relative %: 60 %
Platelets: 165 10*3/uL (ref 150–400)
RBC: 3.03 MIL/uL — ABNORMAL LOW (ref 3.87–5.11)
RDW: 12.7 % (ref 11.5–15.5)
WBC: 5.5 10*3/uL (ref 4.0–10.5)
nRBC: 0 % (ref 0.0–0.2)

## 2022-12-16 LAB — MAGNESIUM: Magnesium: 2.2 mg/dL (ref 1.7–2.4)

## 2022-12-16 MED ORDER — MELATONIN 10 MG PO TABS: 10.0000 mg | ORAL_TABLET | Freq: Every day | ORAL | 0 refills | Status: DC

## 2022-12-16 MED ORDER — FUROSEMIDE 40 MG PO TABS: 40.0000 mg | ORAL_TABLET | Freq: Every day | ORAL | Status: DC | PRN

## 2022-12-16 MED ORDER — ONDANSETRON HCL 4 MG PO TABS: 4.0000 mg | ORAL_TABLET | Freq: Four times a day (QID) | ORAL | 0 refills | Status: DC | PRN

## 2022-12-16 MED ORDER — ACETAMINOPHEN 325 MG PO TABS: 650.0000 mg | ORAL_TABLET | Freq: Two times a day (BID) | ORAL | Status: DC

## 2022-12-16 MED ORDER — ENSURE ENLIVE PO LIQD: 237.0000 mL | Freq: Two times a day (BID) | ORAL | 12 refills | Status: DC

## 2022-12-16 MED ORDER — ACETAMINOPHEN 325 MG PO TABS: 650.0000 mg | ORAL_TABLET | Freq: Four times a day (QID) | ORAL | Status: DC | PRN

## 2022-12-16 NOTE — Progress Notes (Signed)
Physical Therapy Treatment Patient Details Name: Andrea [name] MRN: [id number] DOB: [birth year] Today's Date: 12/16/2022   History of Present Illness Patient is a 89 year old female who presented after a fall at home resulting in right hip pain.imaging revealed  "acute nondisplaced fractures of the right superior and inferior pubic rami as well as sacral fractures with presacral edema".     PMH: chronic R hip dislocation, CHF, severe mitral regurgitation, HTN.    PT Comments    Pt very cooperative and with noted improvement in activity tolerance but continues pain ltd and oriented to self only.   Recommendations for follow up therapy are one component of a multi-disciplinary discharge planning process, led by the attending physician.  Recommendations may be updated based on patient status, additional functional criteria and insurance authorization.  Follow Up Recommendations  Skilled nursing-short term rehab (<3 hours/day) Can patient physically be transported by private vehicle: No   Assistance Recommended at Discharge Frequent or constant Supervision/Assistance  Patient can return home with the following A little help with walking and/or transfers;A little help with bathing/dressing/bathroom;Assistance with cooking/housework;Assist for transportation;Help with stairs or ramp for entrance   Equipment Recommendations  None recommended by PT    Recommendations for Other Services       Precautions / Restrictions Precautions Precautions: Fall Restrictions Weight Bearing Restrictions: No Other Position/Activity Restrictions: WBAT per ortho     Mobility  Bed Mobility Overal bed mobility: Needs Assistance Bed Mobility: Supine to Sit     Supine to sit: Min assist     General bed mobility comments: assist to manage LEs over EOB    Transfers Overall transfer level: Needs assistance Equipment used: Rolling walker (2 wheels) Transfers: Sit to/from Stand Sit to Stand: Min  assist           General transfer comment: assist to rise and steady    Ambulation/Gait Ambulation/Gait assistance: Min assist Gait Distance (Feet): 34 Feet Assistive device: Rolling walker (2 wheels) Gait Pattern/deviations: Step-to pattern, Decreased step length - right, Decreased step length - left, Shuffle, Trunk flexed Gait velocity: decr     General Gait Details: cues for posture and position from RW; distance ltd by increasing pain   Stairs             Wheelchair Mobility    Modified Rankin (Stroke Patients Only)       Balance Overall balance assessment: Needs assistance Sitting-balance support: No upper extremity supported, Feet supported Sitting balance-Leahy Scale: Good     Standing balance support: Single extremity supported Standing balance-Leahy Scale: Poor Standing balance comment: external and UE assist needed                            Cognition Arousal/Alertness: Awake/alert Behavior During Therapy: WFL for tasks assessed/performed Overall Cognitive Status: History of cognitive impairments - at baseline                                 General Comments: patient is oriented to self, pt says she has to get up and moving, just being lazy        Exercises      General Comments        Pertinent Vitals/Pain Pain Assessment Pain Assessment: Faces Faces Pain Scale: Hurts even more Pain Location: R side hip/pelvis area Pain Descriptors / Indicators: Grimacing, Guarding Pain Intervention(s): Limited activity within patient's  tolerance, Monitored during session, Premedicated before session    Home Living                          Prior Function            PT Goals (current goals can now be found in the care plan section) Acute Rehab PT Goals PT Goal Formulation: With patient Time For Goal Achievement: 12/28/22 Potential to Achieve Goals: Good Progress towards PT goals: Progressing toward  goals    Frequency    Min 3X/week      PT Plan Current plan remains appropriate    Co-evaluation              AM-PAC PT "6 Clicks" Mobility   Outcome Measure  Help needed turning from your back to your side while in a flat bed without using bedrails?: A Little Help needed moving from lying on your back to sitting on the side of a flat bed without using bedrails?: A Little Help needed moving to and from a bed to a chair (including a wheelchair)?: A Little Help needed standing up from a chair using your arms (e.g., wheelchair or bedside chair)?: A Little Help needed to walk in hospital room?: A Little Help needed climbing 3-5 steps with a railing? : Total 6 Click Score: 16    End of Session Equipment Utilized During Treatment: Gait belt Activity Tolerance: Patient tolerated treatment well;Patient limited by pain Patient left: in chair;with call bell/phone within reach;with chair alarm set Nurse Communication: Mobility status PT Visit Diagnosis: Other abnormalities of gait and mobility (R26.89);Difficulty in walking, not elsewhere classified (R26.2);Pain Pain - Right/Left: Right Pain - part of body: Hip     Time: 1015-1028 PT Time Calculation (min) (ACUTE ONLY): 13 min  Charges:  $Gait Training: 8-22 mins                     [place] Pager [phone] Office [phone]    Andrea [name] 12/16/2022, 11:45 AM

## 2022-12-16 NOTE — TOC Transition Note (Signed)
Transition of Care [facility]) - CM/SW Discharge Note  Patient Details  Name: Andrea [name] MRN: [id number] Date of Birth: 02/16/1920  [facility] CM/SW Contact:  Sherie Don, LCSW Phone Number: 12/16/2022, 1:51 PM  Clinical Narrative: CSW reviewed bed offers and Medicare star ratings with patient's son, Allona Gondek. Son chose [facility]. CSW confirmed bed with Lexine Baton in admissions at [facility]. Patient will go to room 110 and the number for report is [phone]. Discharge summary, discharge orders, and SNF transfer report faxed to facility in hub. Medical necessity form done; PTAR scheduled. Discharge packet completed. CSW notified son transportation has been set up. RN updated. TOC signing off.    Final next level of care: Skilled Nursing Facility Barriers to Discharge: Barriers Resolved  Patient Goals and CMS Choice CMS Medicare.gov Compare Post Acute Care list provided to:: Patient Represenative (must comment) Choice offered to / list presented to : Adult Children  Discharge Placement PASRR number recieved: 12/15/22         Patient chooses bed at: [facility] Patient to be transferred to facility by: [place] Name of family member notified: Keri Veale (son) Patient and family notified of of transfer: 12/16/22  Discharge Plan and Services Additional resources added to the After Visit Summary for   In-house Referral: Clinical Social Work Post Acute Care Choice: [place]          DME Arranged: N/A DME Agency: NA  Social Determinants of Health (SDOH) Interventions SDOH Screenings   Tobacco Use: Low Risk  (12/13/2022)   Readmission Risk Interventions     No data to display

## 2022-12-16 NOTE — Progress Notes (Signed)
Discharge report called to [facility], LPN at [facility].  AVS placed in packet per Megan, LCSW.  PTAR to transport patient.

## 2022-12-16 NOTE — Discharge Instructions (Signed)
Patient may weight-bear as tolerated with assistance and using an assistive device Patient may participate in physical therapy regimen per the recommendations Patient is DNR Patient may consume a low sodium, low-fat diet

## 2022-12-16 NOTE — Plan of Care (Signed)

## 2022-12-16 NOTE — Discharge Summary (Signed)
Physician Discharge Summary   Patient: Andrea [name] MRN: [id number] DOB: Dec 22, 1919  Admit date:     12/13/2022  Discharge date: 12/16/22  Discharge Physician: Vernelle Emerald   PCP: Leeroy Cha, MD   Recommendations at discharge:   Patient is to weight-bear as tolerated using a walker as an assistive device and assistance Patient may participate in physical therapy regimen per their recommendations while at facility Patient is to maintain all follow-up appointments including follow-up with Dr. Doran Durand with orthopedic surgery in 3 weeks Patient is DNR Patient is to consume a low-sodium low fat diet  Discharge Diagnoses: Principal Problem:   Closed fracture of multiple pubic rami, right, initial encounter [facility]) Active Problems:   Closed sacral fracture ([place])   Dementia with behavioral disturbance (HCC)   Chronic diastolic heart failure ([place])   Essential hypertension   Severe mitral regurgitation  Resolved Problems:   * No resolved hospital problems. *   Hospital Course: 89 y.o. female with medical history significant for chronic diastolic CHF, severe mitral regurgitation, HTN, chronic right hip dislocation ambulatory with a walker who presented to [facility] emergency department with right hip pain after falling at home.  Patient is a known poor historian with cognitive impairment and lives with her son.  Upon evaluation in the emergency department with CT imaging of the pelvis acute nondisplaced fractures of the right superior and inferior pubic rami were identified in addition to acute nondisplaced fracture of the sacrum at the S3 level with presacral edema and stranding.  Due to on going pain secondary to this the hospitalist group was then called to assess the patient for admission the hospital.  Dr. Doran Durand with orthopedic surgery was consulted and conservative management was recommended with outpatient orthopedic surgery follow-up.  Patient was  evaluated by physical therapy and Occupational Therapy with recommendation that that patient would benefit from skilled physical therapy services in a skilled nursing facility.  Patient's pain related to her pelvic and sacral fractures gradually improved with scheduled Tylenol and arrangements were made for the patient to be discharged to [facility] skilled nursing facility in improved and stable condition on 12/16/2022.    Pain control - [employer] Controlled Substance Reporting System database was reviewed. and patient was instructed, not to drive, operate heavy machinery, perform activities at heights, swimming or participation in water activities or provide baby-sitting services while on Pain, Sleep and Anxiety Medications; until their outpatient Physician has advised to do so again. Also recommended to not to take more than prescribed Pain, Sleep and Anxiety Medications.   Consultants: Dr. Doran Durand with Orthopedic surgery Procedures performed: None  Disposition: Skilled nursing facility Diet recommendation:  Discharge Diet Orders (From admission, onward)     Start     Ordered   12/16/22 0000  Diet - low sodium heart healthy        12/16/22 1243           Cardiac diet  DISCHARGE MEDICATION: Allergies as of 12/16/2022   No Known Allergies      Medication List     STOP taking these medications    potassium chloride 10 MEQ CR capsule Commonly known as: MICRO-K       TAKE these medications    acetaminophen 325 MG tablet Commonly known as: TYLENOL Take 2 tablets (650 mg total) by mouth 2 (two) times daily. What changed:  medication strength how much to take when to take this reasons to take this   acetaminophen 325 MG  tablet Commonly known as: TYLENOL Take 2 tablets (650 mg total) by mouth every 6 (six) hours as needed for mild pain (or Fever >/= 101). What changed: You were already taking a medication with the same name, and this prescription was added. Make  sure you understand how and when to take each.   CALCIUM 1200 PO Take 1,200 mg by mouth daily.   feeding supplement Liqd Take 237 mLs by mouth 2 (two) times daily between meals.   furosemide 40 MG tablet Commonly known as: LASIX Take 1 tablet (40 mg total) by mouth daily as needed. Weigh patient daily.  If weight increases by more than 2 pounds in one day or 5 pounds in one week take 1 tablet by mouth that day. What changed:  when to take this reasons to take this additional instructions   Melatonin 10 MG Tabs Take 10 mg by mouth at bedtime.   multivitamin with minerals Tabs tablet Take 1 tablet by mouth daily.   ondansetron 4 MG tablet Commonly known as: ZOFRAN Take 1 tablet (4 mg total) by mouth every 6 (six) hours as needed for nausea.   telmisartan 80 MG tablet Commonly known as: MICARDIS Take 1 tablet (80 mg total) by mouth daily.        Contact information for follow-up providers     Wylene Simmer, MD. Schedule an appointment as soon as possible for a visit in 3 week(s).   Specialty: Orthopedic Surgery Contact information: [address] [place] [address] [phone]              Contact information for after-discharge care     Destination     [facility] Preferred SNF .   Service: Skilled Nursing Contact information: [place] [phone]                     Discharge Exam: Danley Danker Weights   12/13/22 2306  Weight: 46.4 kg    Constitutional: Awake, oriented x 1, no associated distress.   Respiratory: clear to auscultation bilaterally, no wheezing, no crackles. Normal respiratory effort. No accessory muscle use.  Cardiovascular: Regular rate and rhythm, no murmurs / rubs / gallops. No extremity edema. 2+ pedal pulses. No carotid bruits.  Abdomen: Abdomen is soft and nontender.  No evidence of intra-abdominal masses.  Positive bowel sounds noted in all quadrants.    Musculoskeletal: No joint deformity upper and lower extremities. Good ROM, no contractures. Normal muscle tone.     Condition at discharge: fair  The results of significant diagnostics from this hospitalization (including imaging, microbiology, ancillary and laboratory) are listed below for reference.   Imaging Studies: CT PELVIS WO CONTRAST  Result Date: 12/13/2022 CLINICAL DATA:  Hip trauma EXAM: CT PELVIS WITHOUT CONTRAST TECHNIQUE: Multidetector CT imaging of the pelvis was performed following the standard protocol without intravenous contrast. RADIATION DOSE REDUCTION: This exam was performed according to the departmental dose-optimization program which includes automated exposure control, adjustment of the mA and/or kV according to patient size and/or use of iterative reconstruction technique. COMPARISON:  X-ray same day FINDINGS: Urinary Tract:  No abnormality visualized. Bowel:  Unremarkable visualized pelvic bowel loops. Vascular/Lymphatic: There are atherosclerotic calcifications of the aorta and iliac arteries. Reproductive:  Small calcified uterine fibroids are present. Other: There is no free fluid. There is presacral edema and some stranding along the right pelvic sidewall. There is no focal hernia. Musculoskeletal: The bones are diffusely osteopenic. There  is nondisplaced fracture along the anterior cortex of the S3 level. There are subtle nondisplaced acute fractures of the right superior and inferior pubic rami. There are chronic changes of the right hip with flattening and remodeling of the right femoral head. There is acetabular remodeling and sclerosis. There is increased joint space and moderate-sized joint effusion. There is fluid distention of the bursa anteriorly. IMPRESSION: 1. Acute nondisplaced fractures of the right superior and inferior pubic rami. 2. Acute nondisplaced fracture of the sacrum at the S3 level. 3. Presacral edema and stranding along the right pelvic sidewall. 4.  Chronic changes of the right hip with joint effusion and fluid distention of the bursa. Findings may be related to chronic AVN or infection. Aortic Atherosclerosis (ICD10-I70.0). Electronically Signed   By: Ronney Asters M.D.   On: 12/13/2022 19:30   CT HEAD WO CONTRAST (5MM)  Result Date: 12/13/2022 CLINICAL DATA:  Fall.  Head trauma. EXAM: CT HEAD WITHOUT CONTRAST CT CERVICAL SPINE WITHOUT CONTRAST TECHNIQUE: Multidetector CT imaging of the head and cervical spine was performed following the standard protocol without intravenous contrast. Multiplanar CT image reconstructions of the cervical spine were also generated. RADIATION DOSE REDUCTION: This exam was performed according to the departmental dose-optimization program which includes automated exposure control, adjustment of the mA and/or kV according to patient size and/or use of iterative reconstruction technique. COMPARISON:  None Available. FINDINGS: CT HEAD FINDINGS Brain: No evidence of acute infarction, hemorrhage, hydrocephalus, extra-axial collection or mass lesion/mass effect. There is age related ventricular sulcal enlargement. Old right basal ganglia lacunar infarct. Patchy areas of white matter hypoattenuation consistent with mild chronic microvascular ischemic change. Vascular: No hyperdense vessel or unexpected calcification. Skull: Normal. Negative for fracture or focal lesion. Sinuses/Orbits: Globes and orbits are unremarkable. Visualized sinuses are clear. Other: None. CT CERVICAL SPINE FINDINGS Alignment: Grade 1 anterolisthesis of C7 on T1. No other spondylolisthesis. Generalized straightened cervical lordosis. Skull base and vertebrae: No acute fracture. No primary bone lesion or focal pathologic process. Soft tissues and spinal canal: No prevertebral fluid or swelling. No visible canal hematoma. Disc levels: Marked loss of disc height from C2-C3 through C4-C5. Moderate loss of disc height at C5-C6 and C6-C7. Bilateral facet degenerative  changes in the mid to upper cervical spine. No convincing disc herniation. Upper chest: No acute findings.  Lung apices are clear. Other: None. IMPRESSION: HEAD CT 1. No acute intracranial abnormalities. CERVICAL CT 1. No fracture or acute finding. Electronically Signed   By: Lajean Manes M.D.   On: 12/13/2022 19:23   CT Cervical Spine Wo Contrast  Result Date: 12/13/2022 CLINICAL DATA:  Fall.  Head trauma. EXAM: CT HEAD WITHOUT CONTRAST CT CERVICAL SPINE WITHOUT CONTRAST TECHNIQUE: Multidetector CT imaging of the head and cervical spine was performed following the standard protocol without intravenous contrast. Multiplanar CT image reconstructions of the cervical spine were also generated. RADIATION DOSE REDUCTION: This exam was performed according to the departmental dose-optimization program which includes automated exposure control, adjustment of the mA and/or kV according to patient size and/or use of iterative reconstruction technique. COMPARISON:  None Available. FINDINGS: CT HEAD FINDINGS Brain: No evidence of acute infarction, hemorrhage, hydrocephalus, extra-axial collection or mass lesion/mass effect. There is age related ventricular sulcal enlargement. Old right basal ganglia lacunar infarct. Patchy areas of white matter hypoattenuation consistent with mild chronic microvascular ischemic change. Vascular: No hyperdense vessel or unexpected calcification. Skull: Normal. Negative for fracture or focal lesion. Sinuses/Orbits: Globes and orbits are unremarkable. Visualized sinuses are clear.  Other: None. CT CERVICAL SPINE FINDINGS Alignment: Grade 1 anterolisthesis of C7 on T1. No other spondylolisthesis. Generalized straightened cervical lordosis. Skull base and vertebrae: No acute fracture. No primary bone lesion or focal pathologic process. Soft tissues and spinal canal: No prevertebral fluid or swelling. No visible canal hematoma. Disc levels: Marked loss of disc height from C2-C3 through C4-C5.  Moderate loss of disc height at C5-C6 and C6-C7. Bilateral facet degenerative changes in the mid to upper cervical spine. No convincing disc herniation. Upper chest: No acute findings.  Lung apices are clear. Other: None. IMPRESSION: HEAD CT 1. No acute intracranial abnormalities. CERVICAL CT 1. No fracture or acute finding. Electronically Signed   By: Lajean Manes M.D.   On: 12/13/2022 19:23   DG Elbow Complete Right  Result Date: 12/13/2022 CLINICAL DATA:  Mechanical fall yesterday with pain EXAM: RIGHT ELBOW - COMPLETE 4 VIEW COMPARISON:  None Available. FINDINGS: There is no evidence of fracture, dislocation, or joint effusion. There is no evidence of arthropathy or other focal bone abnormality. Soft tissues are unremarkable. Osteopenia. IMPRESSION: No acute osseous abnormality.  Osteopenia. Electronically Signed   By: Jill Side M.D.   On: 12/13/2022 17:48   DG Hip Unilat W or Wo Pelvis 2-3 Views Right  Result Date: 12/13/2022 CLINICAL DATA:  Pain after fall EXAM: DG HIP (WITH OR WITHOUT PELVIS) 3V RIGHT COMPARISON:  X-ray 07/22/2014 FINDINGS: There is progressive severe deformity of the right femoral head consistent with progressive collapse please correlate for any history of AVN. There is some bony remodeling of the acetabulum medially with some protrusio. Mild concentric joint space loss of the left hip. No acute fracture or dislocation. However there is severe osteopenia and if there is further concern follow-up cross-sectional imaging may be of benefit to assess for an occult abnormality. Overlapping cardiac leads. Catheter. Rectal stool. IMPRESSION: Progressive deformity and collapse of the right femoral head and bony remodeling of the adjacent acetabulum. Please correlate for any known history of AVN. Degenerative changes of the left hip. Severe osteopenia. No obvious fracture or dislocation although with this level of osteopenia a subtle nondisplaced injury is difficult to completely exclude  and if needed additional cross-sectional imaging as clinically directed. Electronically Signed   By: Jill Side M.D.   On: 12/13/2022 17:26   DG Chest 1 View  Result Date: 12/13/2022 CLINICAL DATA:  Mechanical fall yesterday.  Pain EXAM: CHEST  1 VIEW COMPARISON:  Two-view x-ray 2016 January FINDINGS: Underinflation with borderline cardiopericardial silhouette. Mild basilar atelectasis or scar. No consolidation, pneumothorax or effusion. No edema. Overlapping cardiac leads. Advanced degenerative changes of the shoulders. There is also cortical irregularity along the left humeral neck. Please correlate for any history of injury or dedicated shoulder x-rays are recommended when appropriate. IMPRESSION: Borderline size heart.  Mild basilar atelectasis. Advanced degenerative changes of the shoulders. Cortical irregularity along the humeral neck medially on the left. Please correlate with any known history or dedicated x-ray when appropriate to assess for injury. Electronically Signed   By: Jill Side M.D.   On: 12/13/2022 17:23    Microbiology: Results for orders placed or performed during the hospital encounter of 09/21/12  Urine culture     Status: None   Collection Time: 09/21/12  3:16 PM   Specimen: Urine, Catheterized  Result Value Ref Range Status   Specimen Description URINE, CATHETERIZED  Final   Special Requests NONE  Final   Culture  Setup Time 09/21/2012 15:55  Final  [place] Count NO GROWTH  Final   Culture NO GROWTH  Final   Report Status 09/22/2012 FINAL  Final    Labs: CBC: Recent Labs  Lab 12/13/22 1708 12/14/22 0340 12/16/22 0325  WBC 8.2 6.8 5.5  NEUTROABS 6.1  --  3.4  HGB 11.4* 11.0* 9.7*  HCT 33.8* 32.5* 28.7*  MCV 93.6 94.8 94.7  PLT 175 169 370   Basic Metabolic Panel: Recent Labs  Lab 12/13/22 1708 12/14/22 0340 12/16/22 0325  NA 137 138 139  K 3.8 3.5 3.6  CL 101 106 106  CO2 '25 25 25  '$ GLUCOSE 102* 118* 111*  BUN 24* 24* 24*  CREATININE 0.86 0.76  0.74  CALCIUM 8.4* 8.3* 8.4*  MG  --   --  2.2   Liver Function Tests: Recent Labs  Lab 12/13/22 1708 12/16/22 0325  AST 17 15  ALT 13 11  ALKPHOS 126 108  BILITOT 1.0 0.9  PROT 6.0* 5.2*  ALBUMIN 3.0* 2.8*   CBG: No results for input(s): "GLUCAP" in the last 168 hours.  Discharge time spent: greater than 30 minutes.  Signed: Vernelle Emerald, MD [facility] 12/16/2022

## 2022-12-16 NOTE — Care Management Important Message (Signed)
Important Message  Patient Details  Name: Andrea [name] MRN: [id number] Date of Birth: October 29, 1920   Medicare Important Message Given:  Yes     Memory Argue 12/16/2022, 1:02 PM

## 2022-12-17 DIAGNOSIS — M6281 Muscle weakness (generalized): Secondary | ICD-10-CM | POA: Diagnosis not present

## 2022-12-20 DIAGNOSIS — S3210XD Unspecified fracture of sacrum, subsequent encounter for fracture with routine healing: Secondary | ICD-10-CM | POA: Diagnosis not present

## 2022-12-20 DIAGNOSIS — M84454D Pathological fracture, pelvis, subsequent encounter for fracture with routine healing: Secondary | ICD-10-CM | POA: Diagnosis not present

## 2022-12-20 DIAGNOSIS — Z9181 History of falling: Secondary | ICD-10-CM | POA: Diagnosis not present

## 2022-12-20 DIAGNOSIS — M6281 Muscle weakness (generalized): Secondary | ICD-10-CM | POA: Diagnosis not present

## 2022-12-20 DIAGNOSIS — S32591D Other specified fracture of right pubis, subsequent encounter for fracture with routine healing: Secondary | ICD-10-CM | POA: Diagnosis not present

## 2022-12-20 DIAGNOSIS — R2681 Unsteadiness on feet: Secondary | ICD-10-CM | POA: Diagnosis not present

## 2022-12-20 DIAGNOSIS — I5032 Chronic diastolic (congestive) heart failure: Secondary | ICD-10-CM | POA: Diagnosis not present

## 2022-12-20 LAB — VITAMIN D 25 HYDROXY (VIT D DEFICIENCY, FRACTURES)

## 2022-12-21 DIAGNOSIS — I10 Essential (primary) hypertension: Secondary | ICD-10-CM | POA: Diagnosis not present

## 2022-12-21 DIAGNOSIS — M6281 Muscle weakness (generalized): Secondary | ICD-10-CM | POA: Diagnosis not present

## 2022-12-21 DIAGNOSIS — M84454D Pathological fracture, pelvis, subsequent encounter for fracture with routine healing: Secondary | ICD-10-CM | POA: Diagnosis not present

## 2022-12-21 DIAGNOSIS — I509 Heart failure, unspecified: Secondary | ICD-10-CM | POA: Diagnosis not present

## 2022-12-22 ENCOUNTER — Other Ambulatory Visit: Payer: Self-pay | Admitting: *Deleted

## 2022-12-22 DIAGNOSIS — L0390 Cellulitis, unspecified: Secondary | ICD-10-CM | POA: Diagnosis not present

## 2022-12-22 DIAGNOSIS — Z9181 History of falling: Secondary | ICD-10-CM | POA: Diagnosis not present

## 2022-12-22 DIAGNOSIS — K5900 Constipation, unspecified: Secondary | ICD-10-CM | POA: Diagnosis not present

## 2022-12-22 NOTE — Patient Outreach (Signed)
Per [facility] Mrs. Hoffart resides in [facility] skilled nursing facility. Screening for potential care coordination needs as benefit of insurance plan and Primary Care Provider.   Collaboration with [employer] social worker to make aware Probation officer is following for transition plans/needs.   PCP office [facility] has Upstream care management.   Will continue to follow.   Marthenia Rolling, MSN, RN,BSN [place] Acute Care Coordinator [phone] (Direct dial)

## 2022-12-23 DIAGNOSIS — S3210XD Unspecified fracture of sacrum, subsequent encounter for fracture with routine healing: Secondary | ICD-10-CM | POA: Diagnosis not present

## 2022-12-23 DIAGNOSIS — Z9181 History of falling: Secondary | ICD-10-CM | POA: Diagnosis not present

## 2022-12-23 DIAGNOSIS — R2681 Unsteadiness on feet: Secondary | ICD-10-CM | POA: Diagnosis not present

## 2022-12-23 DIAGNOSIS — I5032 Chronic diastolic (congestive) heart failure: Secondary | ICD-10-CM | POA: Diagnosis not present

## 2022-12-23 DIAGNOSIS — S32591D Other specified fracture of right pubis, subsequent encounter for fracture with routine healing: Secondary | ICD-10-CM | POA: Diagnosis not present

## 2022-12-27 DIAGNOSIS — Z9181 History of falling: Secondary | ICD-10-CM | POA: Diagnosis not present

## 2022-12-27 DIAGNOSIS — S3210XD Unspecified fracture of sacrum, subsequent encounter for fracture with routine healing: Secondary | ICD-10-CM | POA: Diagnosis not present

## 2022-12-27 DIAGNOSIS — R2681 Unsteadiness on feet: Secondary | ICD-10-CM | POA: Diagnosis not present

## 2022-12-27 DIAGNOSIS — I5032 Chronic diastolic (congestive) heart failure: Secondary | ICD-10-CM | POA: Diagnosis not present

## 2022-12-27 DIAGNOSIS — S32591D Other specified fracture of right pubis, subsequent encounter for fracture with routine healing: Secondary | ICD-10-CM | POA: Diagnosis not present

## 2022-12-30 DIAGNOSIS — S32591D Other specified fracture of right pubis, subsequent encounter for fracture with routine healing: Secondary | ICD-10-CM | POA: Diagnosis not present

## 2022-12-30 DIAGNOSIS — R2681 Unsteadiness on feet: Secondary | ICD-10-CM | POA: Diagnosis not present

## 2022-12-30 DIAGNOSIS — Z9181 History of falling: Secondary | ICD-10-CM | POA: Diagnosis not present

## 2022-12-30 DIAGNOSIS — I5032 Chronic diastolic (congestive) heart failure: Secondary | ICD-10-CM | POA: Diagnosis not present

## 2022-12-30 DIAGNOSIS — S3210XD Unspecified fracture of sacrum, subsequent encounter for fracture with routine healing: Secondary | ICD-10-CM | POA: Diagnosis not present

## 2022-12-31 ENCOUNTER — Other Ambulatory Visit: Payer: Self-pay | Admitting: *Deleted

## 2022-12-31 NOTE — Patient Outreach (Signed)
[facility] Post- Acute Care Coordinator follow up. Mrs. Thedford resides in [facility]. Screening for potential care coordination services as benefit of health plan and Primary Care Provider.   Update received from [employer] Education officer, museum. Transition plans are pending progress. Memory Care vs LTC.   [facility] has Upstream care management.   Will continue to follow.   Marthenia Rolling, MSN, RN,BSN [place] Acute Care Coordinator [phone] (Direct dial)

## 2023-01-03 DIAGNOSIS — L0390 Cellulitis, unspecified: Secondary | ICD-10-CM | POA: Diagnosis not present

## 2023-01-03 DIAGNOSIS — I5032 Chronic diastolic (congestive) heart failure: Secondary | ICD-10-CM | POA: Diagnosis not present

## 2023-01-03 DIAGNOSIS — M84454D Pathological fracture, pelvis, subsequent encounter for fracture with routine healing: Secondary | ICD-10-CM | POA: Diagnosis not present

## 2023-01-03 DIAGNOSIS — I10 Essential (primary) hypertension: Secondary | ICD-10-CM | POA: Diagnosis not present

## 2023-01-03 DIAGNOSIS — Z9181 History of falling: Secondary | ICD-10-CM | POA: Diagnosis not present

## 2023-01-03 DIAGNOSIS — S3210XD Unspecified fracture of sacrum, subsequent encounter for fracture with routine healing: Secondary | ICD-10-CM | POA: Diagnosis not present

## 2023-01-03 DIAGNOSIS — R2681 Unsteadiness on feet: Secondary | ICD-10-CM | POA: Diagnosis not present

## 2023-01-03 DIAGNOSIS — S32591D Other specified fracture of right pubis, subsequent encounter for fracture with routine healing: Secondary | ICD-10-CM | POA: Diagnosis not present

## 2023-01-05 ENCOUNTER — Other Ambulatory Visit: Payer: Self-pay | Admitting: *Deleted

## 2023-01-05 NOTE — Patient Outreach (Signed)
[place] Coordinator follow up. Mrs. Summons resides in [facility] skilled nursing facility.   Met with [employer] Education officer, museum. Anticipated transition plan is [facility].   [facility] office [facility] has Upstream care management.   Marthenia Rolling, MSN, RN,BSN [place] Acute Care Coordinator [phone] (Direct dial)

## 2023-01-06 DIAGNOSIS — S32591D Other specified fracture of right pubis, subsequent encounter for fracture with routine healing: Secondary | ICD-10-CM | POA: Diagnosis not present

## 2023-01-06 DIAGNOSIS — I5032 Chronic diastolic (congestive) heart failure: Secondary | ICD-10-CM | POA: Diagnosis not present

## 2023-01-06 DIAGNOSIS — Z9181 History of falling: Secondary | ICD-10-CM | POA: Diagnosis not present

## 2023-01-06 DIAGNOSIS — R2681 Unsteadiness on feet: Secondary | ICD-10-CM | POA: Diagnosis not present

## 2023-01-06 DIAGNOSIS — S3210XD Unspecified fracture of sacrum, subsequent encounter for fracture with routine healing: Secondary | ICD-10-CM | POA: Diagnosis not present

## 2023-01-10 DIAGNOSIS — Z9181 History of falling: Secondary | ICD-10-CM | POA: Diagnosis not present

## 2023-01-10 DIAGNOSIS — S3210XD Unspecified fracture of sacrum, subsequent encounter for fracture with routine healing: Secondary | ICD-10-CM | POA: Diagnosis not present

## 2023-01-10 DIAGNOSIS — R2681 Unsteadiness on feet: Secondary | ICD-10-CM | POA: Diagnosis not present

## 2023-01-10 DIAGNOSIS — S32591D Other specified fracture of right pubis, subsequent encounter for fracture with routine healing: Secondary | ICD-10-CM | POA: Diagnosis not present

## 2023-01-10 DIAGNOSIS — I5032 Chronic diastolic (congestive) heart failure: Secondary | ICD-10-CM | POA: Diagnosis not present

## 2023-01-12 ENCOUNTER — Other Ambulatory Visit: Payer: Self-pay | Admitting: *Deleted

## 2023-01-12 DIAGNOSIS — I509 Heart failure, unspecified: Secondary | ICD-10-CM | POA: Diagnosis not present

## 2023-01-12 DIAGNOSIS — I10 Essential (primary) hypertension: Secondary | ICD-10-CM | POA: Diagnosis not present

## 2023-01-12 DIAGNOSIS — M84454D Pathological fracture, pelvis, subsequent encounter for fracture with routine healing: Secondary | ICD-10-CM | POA: Diagnosis not present

## 2023-01-12 NOTE — Patient Outreach (Signed)
[place] Coordinator follow up. Mrs. Hornung resides in [facility]. Screening for potential care coordination services as benefit of health plan and Primary Care Provider.  PC[facility] office [facility] has Upstream care management.   Collaboration with [employer] Education officer, museum. Mrs. [place] will transition to [facility] on 01/14/23.  Marthenia Rolling, MSN, RN,BSN [place] Acute Care Coordinator [phone] (Direct dial)

## 2023-01-13 DIAGNOSIS — S32591D Other specified fracture of right pubis, subsequent encounter for fracture with routine healing: Secondary | ICD-10-CM | POA: Diagnosis not present

## 2023-01-13 DIAGNOSIS — I5032 Chronic diastolic (congestive) heart failure: Secondary | ICD-10-CM | POA: Diagnosis not present

## 2023-01-13 DIAGNOSIS — R2681 Unsteadiness on feet: Secondary | ICD-10-CM | POA: Diagnosis not present

## 2023-01-13 DIAGNOSIS — S3210XD Unspecified fracture of sacrum, subsequent encounter for fracture with routine healing: Secondary | ICD-10-CM | POA: Diagnosis not present

## 2023-01-13 DIAGNOSIS — Z9181 History of falling: Secondary | ICD-10-CM | POA: Diagnosis not present

## 2023-01-19 DIAGNOSIS — K5900 Constipation, unspecified: Secondary | ICD-10-CM | POA: Diagnosis not present

## 2023-01-19 DIAGNOSIS — F0394 Unspecified dementia, unspecified severity, with anxiety: Secondary | ICD-10-CM | POA: Diagnosis not present

## 2023-01-19 DIAGNOSIS — Z79899 Other long term (current) drug therapy: Secondary | ICD-10-CM | POA: Diagnosis not present

## 2023-01-19 DIAGNOSIS — F5101 Primary insomnia: Secondary | ICD-10-CM | POA: Diagnosis not present

## 2023-01-19 DIAGNOSIS — I10 Essential (primary) hypertension: Secondary | ICD-10-CM | POA: Diagnosis not present

## 2023-01-25 DIAGNOSIS — I340 Nonrheumatic mitral (valve) insufficiency: Secondary | ICD-10-CM | POA: Diagnosis not present

## 2023-01-25 DIAGNOSIS — Z79891 Long term (current) use of opiate analgesic: Secondary | ICD-10-CM | POA: Diagnosis not present

## 2023-01-25 DIAGNOSIS — M84454D Pathological fracture, pelvis, subsequent encounter for fracture with routine healing: Secondary | ICD-10-CM | POA: Diagnosis not present

## 2023-01-25 DIAGNOSIS — Z9181 History of falling: Secondary | ICD-10-CM | POA: Diagnosis not present

## 2023-01-25 DIAGNOSIS — G309 Alzheimer's disease, unspecified: Secondary | ICD-10-CM | POA: Diagnosis not present

## 2023-01-25 DIAGNOSIS — S3216XD Type 3 fracture of sacrum, subsequent encounter for fracture with routine healing: Secondary | ICD-10-CM | POA: Diagnosis not present

## 2023-01-25 DIAGNOSIS — S32511D Fracture of superior rim of right pubis, subsequent encounter for fracture with routine healing: Secondary | ICD-10-CM | POA: Diagnosis not present

## 2023-01-25 DIAGNOSIS — I110 Hypertensive heart disease with heart failure: Secondary | ICD-10-CM | POA: Diagnosis not present

## 2023-01-25 DIAGNOSIS — R1310 Dysphagia, unspecified: Secondary | ICD-10-CM | POA: Diagnosis not present

## 2023-01-25 DIAGNOSIS — F5101 Primary insomnia: Secondary | ICD-10-CM | POA: Diagnosis not present

## 2023-01-25 DIAGNOSIS — I5032 Chronic diastolic (congestive) heart failure: Secondary | ICD-10-CM | POA: Diagnosis not present

## 2023-01-25 DIAGNOSIS — F0284 Dementia in other diseases classified elsewhere, unspecified severity, with anxiety: Secondary | ICD-10-CM | POA: Diagnosis not present

## 2023-01-26 DIAGNOSIS — F0394 Unspecified dementia, unspecified severity, with anxiety: Secondary | ICD-10-CM | POA: Diagnosis not present

## 2023-01-26 DIAGNOSIS — I10 Essential (primary) hypertension: Secondary | ICD-10-CM | POA: Diagnosis not present

## 2023-01-26 DIAGNOSIS — I739 Peripheral vascular disease, unspecified: Secondary | ICD-10-CM | POA: Diagnosis not present

## 2023-01-28 DIAGNOSIS — G309 Alzheimer's disease, unspecified: Secondary | ICD-10-CM | POA: Diagnosis not present

## 2023-01-28 DIAGNOSIS — M84454D Pathological fracture, pelvis, subsequent encounter for fracture with routine healing: Secondary | ICD-10-CM | POA: Diagnosis not present

## 2023-01-28 DIAGNOSIS — F0284 Dementia in other diseases classified elsewhere, unspecified severity, with anxiety: Secondary | ICD-10-CM | POA: Diagnosis not present

## 2023-01-28 DIAGNOSIS — I110 Hypertensive heart disease with heart failure: Secondary | ICD-10-CM | POA: Diagnosis not present

## 2023-01-28 DIAGNOSIS — S3216XD Type 3 fracture of sacrum, subsequent encounter for fracture with routine healing: Secondary | ICD-10-CM | POA: Diagnosis not present

## 2023-01-28 DIAGNOSIS — S32511D Fracture of superior rim of right pubis, subsequent encounter for fracture with routine healing: Secondary | ICD-10-CM | POA: Diagnosis not present

## 2023-02-01 DIAGNOSIS — M84454D Pathological fracture, pelvis, subsequent encounter for fracture with routine healing: Secondary | ICD-10-CM | POA: Diagnosis not present

## 2023-02-01 DIAGNOSIS — S32511D Fracture of superior rim of right pubis, subsequent encounter for fracture with routine healing: Secondary | ICD-10-CM | POA: Diagnosis not present

## 2023-02-01 DIAGNOSIS — I110 Hypertensive heart disease with heart failure: Secondary | ICD-10-CM | POA: Diagnosis not present

## 2023-02-01 DIAGNOSIS — S3216XD Type 3 fracture of sacrum, subsequent encounter for fracture with routine healing: Secondary | ICD-10-CM | POA: Diagnosis not present

## 2023-02-01 DIAGNOSIS — F0284 Dementia in other diseases classified elsewhere, unspecified severity, with anxiety: Secondary | ICD-10-CM | POA: Diagnosis not present

## 2023-02-01 DIAGNOSIS — G309 Alzheimer's disease, unspecified: Secondary | ICD-10-CM | POA: Diagnosis not present

## 2023-02-02 DIAGNOSIS — I158 Other secondary hypertension: Secondary | ICD-10-CM | POA: Diagnosis not present

## 2023-02-02 DIAGNOSIS — I739 Peripheral vascular disease, unspecified: Secondary | ICD-10-CM | POA: Diagnosis not present

## 2023-02-04 DIAGNOSIS — S3216XD Type 3 fracture of sacrum, subsequent encounter for fracture with routine healing: Secondary | ICD-10-CM | POA: Diagnosis not present

## 2023-02-04 DIAGNOSIS — S32511D Fracture of superior rim of right pubis, subsequent encounter for fracture with routine healing: Secondary | ICD-10-CM | POA: Diagnosis not present

## 2023-02-04 DIAGNOSIS — I110 Hypertensive heart disease with heart failure: Secondary | ICD-10-CM | POA: Diagnosis not present

## 2023-02-04 DIAGNOSIS — F0284 Dementia in other diseases classified elsewhere, unspecified severity, with anxiety: Secondary | ICD-10-CM | POA: Diagnosis not present

## 2023-02-04 DIAGNOSIS — G309 Alzheimer's disease, unspecified: Secondary | ICD-10-CM | POA: Diagnosis not present

## 2023-02-04 DIAGNOSIS — M84454D Pathological fracture, pelvis, subsequent encounter for fracture with routine healing: Secondary | ICD-10-CM | POA: Diagnosis not present

## 2023-02-07 DIAGNOSIS — S3216XD Type 3 fracture of sacrum, subsequent encounter for fracture with routine healing: Secondary | ICD-10-CM | POA: Diagnosis not present

## 2023-02-07 DIAGNOSIS — G309 Alzheimer's disease, unspecified: Secondary | ICD-10-CM | POA: Diagnosis not present

## 2023-02-07 DIAGNOSIS — M84454D Pathological fracture, pelvis, subsequent encounter for fracture with routine healing: Secondary | ICD-10-CM | POA: Diagnosis not present

## 2023-02-07 DIAGNOSIS — F0284 Dementia in other diseases classified elsewhere, unspecified severity, with anxiety: Secondary | ICD-10-CM | POA: Diagnosis not present

## 2023-02-07 DIAGNOSIS — I110 Hypertensive heart disease with heart failure: Secondary | ICD-10-CM | POA: Diagnosis not present

## 2023-02-07 DIAGNOSIS — S32511D Fracture of superior rim of right pubis, subsequent encounter for fracture with routine healing: Secondary | ICD-10-CM | POA: Diagnosis not present

## 2023-02-08 DIAGNOSIS — M84454D Pathological fracture, pelvis, subsequent encounter for fracture with routine healing: Secondary | ICD-10-CM | POA: Diagnosis not present

## 2023-02-08 DIAGNOSIS — I110 Hypertensive heart disease with heart failure: Secondary | ICD-10-CM | POA: Diagnosis not present

## 2023-02-08 DIAGNOSIS — S32511D Fracture of superior rim of right pubis, subsequent encounter for fracture with routine healing: Secondary | ICD-10-CM | POA: Diagnosis not present

## 2023-02-08 DIAGNOSIS — S3216XD Type 3 fracture of sacrum, subsequent encounter for fracture with routine healing: Secondary | ICD-10-CM | POA: Diagnosis not present

## 2023-02-08 DIAGNOSIS — G309 Alzheimer's disease, unspecified: Secondary | ICD-10-CM | POA: Diagnosis not present

## 2023-02-08 DIAGNOSIS — F0284 Dementia in other diseases classified elsewhere, unspecified severity, with anxiety: Secondary | ICD-10-CM | POA: Diagnosis not present

## 2023-02-10 DIAGNOSIS — G309 Alzheimer's disease, unspecified: Secondary | ICD-10-CM | POA: Diagnosis not present

## 2023-02-10 DIAGNOSIS — I110 Hypertensive heart disease with heart failure: Secondary | ICD-10-CM | POA: Diagnosis not present

## 2023-02-10 DIAGNOSIS — F0284 Dementia in other diseases classified elsewhere, unspecified severity, with anxiety: Secondary | ICD-10-CM | POA: Diagnosis not present

## 2023-02-10 DIAGNOSIS — S32511D Fracture of superior rim of right pubis, subsequent encounter for fracture with routine healing: Secondary | ICD-10-CM | POA: Diagnosis not present

## 2023-02-10 DIAGNOSIS — M84454D Pathological fracture, pelvis, subsequent encounter for fracture with routine healing: Secondary | ICD-10-CM | POA: Diagnosis not present

## 2023-02-10 DIAGNOSIS — S3216XD Type 3 fracture of sacrum, subsequent encounter for fracture with routine healing: Secondary | ICD-10-CM | POA: Diagnosis not present

## 2023-02-15 DIAGNOSIS — F0284 Dementia in other diseases classified elsewhere, unspecified severity, with anxiety: Secondary | ICD-10-CM | POA: Diagnosis not present

## 2023-02-15 DIAGNOSIS — G309 Alzheimer's disease, unspecified: Secondary | ICD-10-CM | POA: Diagnosis not present

## 2023-02-15 DIAGNOSIS — S32511D Fracture of superior rim of right pubis, subsequent encounter for fracture with routine healing: Secondary | ICD-10-CM | POA: Diagnosis not present

## 2023-02-15 DIAGNOSIS — S3216XD Type 3 fracture of sacrum, subsequent encounter for fracture with routine healing: Secondary | ICD-10-CM | POA: Diagnosis not present

## 2023-02-15 DIAGNOSIS — I110 Hypertensive heart disease with heart failure: Secondary | ICD-10-CM | POA: Diagnosis not present

## 2023-02-15 DIAGNOSIS — M84454D Pathological fracture, pelvis, subsequent encounter for fracture with routine healing: Secondary | ICD-10-CM | POA: Diagnosis not present

## 2023-02-16 DIAGNOSIS — I10 Essential (primary) hypertension: Secondary | ICD-10-CM | POA: Diagnosis not present

## 2023-02-16 DIAGNOSIS — J019 Acute sinusitis, unspecified: Secondary | ICD-10-CM | POA: Diagnosis not present

## 2023-02-16 DIAGNOSIS — F0394 Unspecified dementia, unspecified severity, with anxiety: Secondary | ICD-10-CM | POA: Diagnosis not present

## 2023-02-17 ENCOUNTER — Emergency Department (HOSPITAL_COMMUNITY): Payer: Medicare Other

## 2023-02-17 ENCOUNTER — Inpatient Hospital Stay (HOSPITAL_COMMUNITY)
Admission: EM | Admit: 2023-02-17 | Discharge: 2023-03-07 | DRG: 951 | Disposition: E | Payer: Medicare Other | Source: Skilled Nursing Facility | Attending: Family Medicine | Admitting: Family Medicine

## 2023-02-17 DIAGNOSIS — F03918 Unspecified dementia, unspecified severity, with other behavioral disturbance: Secondary | ICD-10-CM | POA: Diagnosis present

## 2023-02-17 DIAGNOSIS — I340 Nonrheumatic mitral (valve) insufficiency: Secondary | ICD-10-CM | POA: Diagnosis present

## 2023-02-17 DIAGNOSIS — A419 Sepsis, unspecified organism: Secondary | ICD-10-CM | POA: Diagnosis present

## 2023-02-17 DIAGNOSIS — R609 Edema, unspecified: Secondary | ICD-10-CM | POA: Diagnosis not present

## 2023-02-17 DIAGNOSIS — S3216XD Type 3 fracture of sacrum, subsequent encounter for fracture with routine healing: Secondary | ICD-10-CM | POA: Diagnosis not present

## 2023-02-17 DIAGNOSIS — L03116 Cellulitis of left lower limb: Secondary | ICD-10-CM

## 2023-02-17 DIAGNOSIS — Z66 Do not resuscitate: Secondary | ICD-10-CM | POA: Diagnosis present

## 2023-02-17 DIAGNOSIS — G9341 Metabolic encephalopathy: Secondary | ICD-10-CM | POA: Diagnosis present

## 2023-02-17 DIAGNOSIS — R296 Repeated falls: Secondary | ICD-10-CM | POA: Diagnosis present

## 2023-02-17 DIAGNOSIS — I10 Essential (primary) hypertension: Secondary | ICD-10-CM | POA: Diagnosis not present

## 2023-02-17 DIAGNOSIS — L089 Local infection of the skin and subcutaneous tissue, unspecified: Secondary | ICD-10-CM | POA: Diagnosis not present

## 2023-02-17 DIAGNOSIS — R6521 Severe sepsis with septic shock: Secondary | ICD-10-CM | POA: Diagnosis present

## 2023-02-17 DIAGNOSIS — Z993 Dependence on wheelchair: Secondary | ICD-10-CM | POA: Diagnosis not present

## 2023-02-17 DIAGNOSIS — Z801 Family history of malignant neoplasm of trachea, bronchus and lung: Secondary | ICD-10-CM | POA: Diagnosis not present

## 2023-02-17 DIAGNOSIS — S32511D Fracture of superior rim of right pubis, subsequent encounter for fracture with routine healing: Secondary | ICD-10-CM | POA: Diagnosis not present

## 2023-02-17 DIAGNOSIS — M84454D Pathological fracture, pelvis, subsequent encounter for fracture with routine healing: Secondary | ICD-10-CM | POA: Diagnosis not present

## 2023-02-17 DIAGNOSIS — I5032 Chronic diastolic (congestive) heart failure: Secondary | ICD-10-CM | POA: Diagnosis present

## 2023-02-17 DIAGNOSIS — R4182 Altered mental status, unspecified: Secondary | ICD-10-CM | POA: Diagnosis not present

## 2023-02-17 DIAGNOSIS — N179 Acute kidney failure, unspecified: Secondary | ICD-10-CM | POA: Diagnosis present

## 2023-02-17 DIAGNOSIS — F0284 Dementia in other diseases classified elsewhere, unspecified severity, with anxiety: Secondary | ICD-10-CM | POA: Diagnosis not present

## 2023-02-17 DIAGNOSIS — Z515 Encounter for palliative care: Principal | ICD-10-CM

## 2023-02-17 DIAGNOSIS — L0390 Cellulitis, unspecified: Secondary | ICD-10-CM | POA: Diagnosis present

## 2023-02-17 DIAGNOSIS — E872 Acidosis, unspecified: Secondary | ICD-10-CM | POA: Diagnosis present

## 2023-02-17 DIAGNOSIS — R6520 Severe sepsis without septic shock: Secondary | ICD-10-CM | POA: Diagnosis not present

## 2023-02-17 DIAGNOSIS — M810 Age-related osteoporosis without current pathological fracture: Secondary | ICD-10-CM | POA: Diagnosis present

## 2023-02-17 DIAGNOSIS — I110 Hypertensive heart disease with heart failure: Secondary | ICD-10-CM | POA: Diagnosis present

## 2023-02-17 DIAGNOSIS — D649 Anemia, unspecified: Secondary | ICD-10-CM | POA: Diagnosis present

## 2023-02-17 DIAGNOSIS — G309 Alzheimer's disease, unspecified: Secondary | ICD-10-CM | POA: Diagnosis not present

## 2023-02-17 DIAGNOSIS — R531 Weakness: Secondary | ICD-10-CM | POA: Diagnosis not present

## 2023-02-17 LAB — COMPREHENSIVE METABOLIC PANEL
ALT: 24 U/L (ref 0–44)
AST: 42 U/L — ABNORMAL HIGH (ref 15–41)
Albumin: 3 g/dL — ABNORMAL LOW (ref 3.5–5.0)
Alkaline Phosphatase: 94 U/L (ref 38–126)
Anion gap: 13 (ref 5–15)
BUN: 50 mg/dL — ABNORMAL HIGH (ref 8–23)
CO2: 21 mmol/L — ABNORMAL LOW (ref 22–32)
Calcium: 9.1 mg/dL (ref 8.9–10.3)
Chloride: 102 mmol/L (ref 98–111)
Creatinine, Ser: 2.03 mg/dL — ABNORMAL HIGH (ref 0.44–1.00)
GFR, Estimated: 22 mL/min — ABNORMAL LOW (ref >60)
Glucose, Bld: 130 mg/dL — ABNORMAL HIGH (ref 70–99)
Potassium: 4 mmol/L (ref 3.5–5.1)
Sodium: 136 mmol/L (ref 135–145)
Total Bilirubin: 1 mg/dL (ref 0.3–1.2)
Total Protein: 6.4 g/dL — ABNORMAL LOW (ref 6.5–8.1)

## 2023-02-17 LAB — CBC WITH DIFFERENTIAL/PLATELET
Abs Immature Granulocytes: 0.26 10*3/uL — ABNORMAL HIGH (ref 0.00–0.07)
Basophils Absolute: 0 10*3/uL (ref 0.0–0.1)
Basophils Relative: 0 %
Eosinophils Absolute: 0.2 10*3/uL (ref 0.0–0.5)
Eosinophils Relative: 1 %
HCT: 36.5 % (ref 36.0–46.0)
Hemoglobin: 11.9 g/dL — ABNORMAL LOW (ref 12.0–15.0)
Immature Granulocytes: 1 %
Lymphocytes Relative: 2 %
Lymphs Abs: 0.4 10*3/uL — ABNORMAL LOW (ref 0.7–4.0)
MCH: 31.4 pg (ref 26.0–34.0)
MCHC: 32.6 g/dL (ref 30.0–36.0)
MCV: 96.3 fL (ref 80.0–100.0)
Monocytes Absolute: 0.8 10*3/uL (ref 0.1–1.0)
Monocytes Relative: 3 %
Neutro Abs: 22.8 10*3/uL — ABNORMAL HIGH (ref 1.7–7.7)
Neutrophils Relative %: 93 %
Platelets: 219 10*3/uL (ref 150–400)
RBC: 3.79 MIL/uL — ABNORMAL LOW (ref 3.87–5.11)
RDW: 14.4 % (ref 11.5–15.5)
WBC: 24.4 10*3/uL — ABNORMAL HIGH (ref 4.0–10.5)
nRBC: 0 % (ref 0.0–0.2)

## 2023-02-17 LAB — TROPONIN I (HIGH SENSITIVITY)
Troponin I (High Sensitivity): 94 ng/L — ABNORMAL HIGH (ref <18)
Troponin I (High Sensitivity): 94 ng/L — ABNORMAL HIGH (ref <18)

## 2023-02-17 LAB — BRAIN NATRIURETIC PEPTIDE: B Natriuretic Peptide: 572 pg/mL — ABNORMAL HIGH (ref 0.0–100.0)

## 2023-02-17 LAB — LACTIC ACID, PLASMA
Lactic Acid, Venous: 3.5 mmol/L (ref 0.5–1.9)
Lactic Acid, Venous: 2.9 mmol/L (ref 0.5–1.9)

## 2023-02-17 MED ORDER — LACTATED RINGERS IV BOLUS (SEPSIS)
250.0000 mL | Freq: Once | INTRAVENOUS | Status: AC
  Administered 2023-02-17: 250 mL via INTRAVENOUS

## 2023-02-17 MED ORDER — MORPHINE SULFATE (PF) 2 MG/ML IV SOLN
2.0000 mg | INTRAVENOUS | Status: DC | PRN
  Administered 2023-02-17: 2 mg via INTRAVENOUS
  Filled 2023-02-17: qty 1

## 2023-02-17 MED ORDER — LACTATED RINGERS IV SOLN: INTRAVENOUS | Status: DC

## 2023-02-17 MED ORDER — MORPHINE SULFATE (PF) 2 MG/ML IV SOLN
2.0000 mg | INTRAVENOUS | Status: DC | PRN
  Administered 2023-02-18 – 2023-02-21 (×11): 2 mg via INTRAVENOUS
  Filled 2023-02-17 (×11): qty 1

## 2023-02-17 MED ORDER — SODIUM CHLORIDE 0.9 % IV SOLN
2.0000 g | INTRAVENOUS | Status: DC
  Administered 2023-02-17: 2 g via INTRAVENOUS
  Filled 2023-02-17: qty 20

## 2023-02-17 MED ORDER — LACTATED RINGERS IV BOLUS (SEPSIS)
1000.0000 mL | Freq: Once | INTRAVENOUS | Status: AC
  Administered 2023-02-17: 1000 mL via INTRAVENOUS

## 2023-02-17 NOTE — Assessment & Plan Note (Signed)
Avoid over agressive fluid resuscitation, family wants to dc fluids, agree

## 2023-02-17 NOTE — ED Notes (Signed)
Patient currently resting peacefully. Temp delayed.

## 2023-02-17 NOTE — Sepsis Progress Note (Signed)
Antibiotics hung @ 1700, blood culture drawn @ 1708

## 2023-02-17 NOTE — ED Triage Notes (Signed)
BIBA from [facility] memory care unit for left leg swelling, redness, multiple weeping wounds. Per staff, pt leg was not swollen or red yesterday. Pt was started on Amoxicillin this AM.  146/73 BP 88 HR 22 RR

## 2023-02-17 NOTE — Sepsis Progress Note (Signed)
Elink monitoring for the code sepsis protocol.  

## 2023-02-17 NOTE — Assessment & Plan Note (Signed)
Pt is comfort care only   Will hold off on cellulitis protocol

## 2023-02-17 NOTE — Assessment & Plan Note (Signed)
Allow permissive htn ?

## 2023-02-17 NOTE — Assessment & Plan Note (Signed)
In the setting of severe sepsis Pt is comfort care only

## 2023-02-17 NOTE — Assessment & Plan Note (Signed)
-  SIRS criteria met with  elevated white blood cell count,       Component Value Date/Time   WBC 24.4 (H) 18-Feb-2023 1506   LYMPHSABS 0.4 (L) Feb 18, 2023 1506     tachycardia   ,     RR >20 Today's Vitals   2023-02-18 2047 02-18-23 2130 02-18-23 2133 2023-02-18 2211  BP:  (!) 111/54    Pulse: (!) 116 (!) 104 100   Resp:      Temp:      TempSrc:      SpO2: 100% 97% 98%   PainSc:    Asleep      The recent clinical data is shown below. Vitals:   02-18-23 2046 02/18/2023 2047 Feb 18, 2023 2130 2023-02-18 2133  BP:   (!) 111/54   Pulse: (!) 117 (!) 116 (!) 104 100  Resp:      Temp:      TempSrc:      SpO2: 98% 100% 97% 98%     -Most likely source being:  Cellulitis,    Patient meeting criteria for Severe sepsis with    evidence of end organ damage/organ dysfunction such as   Acute Kidney Injury with Cr > 2,  Lab Results  Component Value Date   CREATININE 2.03 (H) Feb 18, 2023   CREATININE 0.74 12/16/2022    elevated lactic acid >2     Component Value Date/Time   LATICACIDVEN 2.9 ([place]) February 18, 2023 8882    acute metabolic encephalopathy      Fluid resuscitation however was limited because  (patient is   comfort care status OR of shared decision making with patient/ guardian, who prefer limited resuscitation with fluid  10:44 PM

## 2023-02-17 NOTE — Sepsis Progress Note (Signed)
Notified bedside nurse of need to draw lactic acid.  

## 2023-02-17 NOTE — Assessment & Plan Note (Signed)
Chronic expect some sun dowing

## 2023-02-17 NOTE — ED Provider Notes (Signed)
[facility] Provider Note   CSN: [id number] Arrival date & time: 02/17/23  1417     History Chief Complaint  Patient presents with   Leg Swelling   Wound Infection    HPI Andrea [name] is a 89 y.o. female presenting for altered mental status.  Presented from her skilled nursing facility for severe left lower extremity swelling.  Patient's son provides the history.  States that she was at her baseline when he last saw her 3 days ago.  She did have cuts on her legs from a fall 2 weeks ago.  Her mental status at baseline is only oriented to self.  Today she is only groaning to pain.  She is DNR per paperwork/son.    Patient's recorded medical, surgical, social, medication list and allergies were reviewed in the Snapshot window as part of the initial history.   Review of Systems   Review of Systems  Constitutional:  Negative for chills and fever.  HENT:  Negative for ear pain and sore throat.   Eyes:  Negative for pain and visual disturbance.  Respiratory:  Negative for cough and shortness of breath.   Cardiovascular:  Negative for chest pain and palpitations.  Gastrointestinal:  Negative for abdominal pain and vomiting.  Genitourinary:  Negative for dysuria and hematuria.  Musculoskeletal:  Negative for arthralgias and back pain.  Skin:  Positive for color change and wound. Negative for rash.  Neurological:  Negative for seizures and syncope.  All other systems reviewed and are negative.   Physical Exam Updated Vital Signs BP (!) 111/54   Pulse 100   Temp 97.9 F (36.6 C) (Oral)   Resp 16   LMP  (LMP Unknown)   SpO2 98%  Physical Exam Vitals and nursing note reviewed.  Constitutional:      General: She is not in acute distress.    Appearance: She is well-developed.  HENT:     Head: Normocephalic and atraumatic.  Eyes:     Conjunctiva/sclera: Conjunctivae normal.  Cardiovascular:     Rate and Rhythm: Normal rate and  regular rhythm.     Heart sounds: No murmur heard. Pulmonary:     Effort: Pulmonary effort is normal. No respiratory distress.     Breath sounds: Normal breath sounds.  Abdominal:     General: There is no distension.     Palpations: Abdomen is soft.     Tenderness: There is no abdominal tenderness. There is no right CVA tenderness or left CVA tenderness.  Musculoskeletal:        General: Deformity and signs of injury present. No swelling or tenderness. Normal range of motion.     Cervical back: Neck supple.  Skin:    General: Skin is warm and dry.  Neurological:     General: No focal deficit present.     Mental Status: She is alert and oriented to person, place, and time. Mental status is at baseline.     Cranial Nerves: No cranial nerve deficit.           ED Course/ Medical Decision Making/ A&P    Procedures .Critical Care  Performed by: Tretha Sciara, MD Authorized by: Tretha Sciara, MD   Critical care provider statement:    Critical care time (minutes):  85   Critical care was necessary to treat or prevent imminent or life-threatening deterioration of the following conditions:  Sepsis, shock and dehydration   Critical care was time spent personally by  me on the following activities:  Development of treatment plan with patient or surrogate, discussions with consultants, evaluation of patient's response to treatment, examination of patient, ordering and review of laboratory studies, ordering and review of radiographic studies, ordering and performing treatments and interventions, pulse oximetry, re-evaluation of patient's condition, review of old charts and obtaining history from patient or surrogate   Care discussed with: admitting provider      Medications Ordered in ED Medications  lactated ringers infusion (0 mLs Intravenous Stopped 02/17/23 2203)  morphine (PF) 2 MG/ML injection 2 mg (has no administration in time range)  lactated ringers bolus 1,000 mL (0  mLs Intravenous Stopped 02/17/23 1941)    And  lactated ringers bolus 250 mL (0 mLs Intravenous Stopped 02/17/23 1941)    Medical Decision Making:    Jisel Wintjen is a 89 y.o. female who presented to the ED today with left lower extremity swelling, severe pain, altered mental status detailed above.    This is a critically ill female.  She meets criteria for activation of code sepsis due to SIRS criteria was started with IV ceftriaxone, lactic acid, blood culture, IV fluid resuscitation. I immediately called her son and due to the severity of her presentation.  While pending these laboratory results I informed him of her significant illness today. He came emergently to the department for in person evaluation. Laboratory results reveal acute renal failure, lactic acidosis, septic shock.  Initial response to IV fluids and antibiotics as appropriate, however patient's clinical appearance worsens with worsening altered mental status and pain.  I am concerned for developing necrotizing fasciitis however patient is not an operative candidate given her advanced age and dementia. This is likely a terminal diagnosis.  I informed the family of these findings.  Whether this is sepsis secondary to cellulitis or developing necrotizing fasciitis, this is likely a terminal infection given her developing renal failure.  Family states that she has been miserable at the nursing home for a long time and does not want any further life-prolonging measures given her underlying dementia.  They have requested transition of scope of care to comfort scope.  Patient's guardian and son Andrea [name] who is her longstanding decision maker has made these decisions.  He informed that he has updated the family about the mother's severe diagnosis and family was in agreement with proceeding with comfort care measures. Patient transitioned to comfort care scope of treatment, TOC consultation for hospice in place was initiated and hospitalist was  consulted for admission pending ultimate demise Comfort scope of therapy was activated including as needed pain control and discomfort control.  Disposition:   Based on the above findings, I believe this patient is stable for admission.    Patient/family educated about specific findings on our evaluation and explained exact reasons for admission.  Patient/family educated about clinical situation and time was allowed to answer questions.   Admission team communicated with and agreed with need for admission. Patient admitted. Patient ready to move at this time.     Emergency Department Medication Summary:   Medications  lactated ringers infusion (0 mLs Intravenous Stopped 02/17/23 2203)  morphine (PF) 2 MG/ML injection 2 mg (has no administration in time range)  lactated ringers bolus 1,000 mL (0 mLs Intravenous Stopped 02/17/23 1941)    And  lactated ringers bolus 250 mL (0 mLs Intravenous Stopped 02/17/23 1941)         clinical Impression:  1. Sepsis with acute renal failure and septic shock, due to  unspecified organism, unspecified acute renal failure type ([place])      Admit   Final Clinical Impression(s) / ED Diagnoses Final diagnoses:  Sepsis with acute renal failure and septic shock, due to unspecified organism, unspecified acute renal failure type [facility])    Rx / DC Orders ED Discharge Orders     None         Tretha Sciara, MD 02/17/23 2239

## 2023-02-17 NOTE — Sepsis Progress Note (Signed)
Time updated on blood culture to 1650 with antibiotics given @ 1700

## 2023-02-17 NOTE — Subjective & Objective (Signed)
[facility] with left leg swelling redness and wounds.  Seems to have occurred over the past 24 hours she was started on amoxicillin in the morning on arrival blood pressure 146/73 heart rate 88  Was last time seen by her son 3 days ago and she had a fall about 2 weeks ago sustaining some cuts to the left leg.  At baseline she is only oriented to her subjective secondary to dementia.  Today she has been more groaning in pain.

## 2023-02-17 NOTE — ED Notes (Signed)
Date and time results received: 02/17/23 7:39 PM  (use smartphrase ".now" to insert current time)  Test: Lactic acid Critical Value: 3.5  Name of Provider Notified: [facility]   Orders Received? Or Actions Taken?: Orders Received - See Orders for details

## 2023-02-17 NOTE — H&P (Signed)
Codi Lansden [id number] DOB: Nov 12, 1920 DOA: 02/17/2023     PCP: Leeroy Cha, MD      Patient arrived to ER on 02/17/23 at 1417 Referred by Attending Tretha Sciara, MD   Patient coming from:     From facility    Chief Complaint:   Chief Complaint  Patient presents with   Leg Swelling   Wound Infection    HPI: Sashi Mcconaghy is a 89 y.o. female with medical history significant of dementia history of sacral fracture, chronic diastolic CHF, HTN severe mitral regurgitation chronic right hip dislocation multiple falls    Presented with   left leg swelling and pain [facility] with left leg swelling redness and wounds.  Seems to have occurred over the past 24 hours she was started on amoxicillin in the morning on arrival blood pressure 146/73 heart rate 88  Was last time seen by her son 3 days ago and she had a fall about 2 weeks ago sustaining some cuts to the left leg.  At baseline she is only oriented to her subjective secondary to dementia.  Today she has been more groaning in pain.      Regarding pertinent Chronic problems:      HTN on Micardis   chronic CHF diastolic  -   Lasix         Dementia - not on any meds    Chronic anemia - baseline hg Hemoglobin & Hematocrit  Recent Labs    12/14/22 0340 12/16/22 0325 02/17/23 1506  HGB 11.0* 9.7* 11.9*     While in ER: Found to be septic  Suspect necrotizing fasciitis  After discussion with family want to hold off on IV fluids, or antibiotics   Only comfort care    CXR -  NON acute    Following Medications were ordered in ER: Medications  lactated ringers infusion ( Intravenous New Bag/Given 02/17/23 1743)  cefTRIAXone (ROCEPHIN) 2 g in sodium chloride 0.9 % 100 mL IVPB (0 g Intravenous Stopped 02/17/23 1730)  morphine (PF) 2 MG/ML injection 2 mg (has no administration in time range)  lactated ringers bolus 1,000 mL (0 mLs Intravenous Stopped 02/17/23  1941)    And  lactated ringers bolus 250 mL (0 mLs Intravenous Stopped 02/17/23 1941)       ED Triage Vitals [02/17/23 1445]  Enc Vitals Group     BP (!) 125/51     Pulse Rate 95     Resp 20     Temp 97.9 F (36.6 C)     Temp Source Oral     SpO2 100 %     Weight      Height      Head Circumference      Peak Flow      Pain Score      Pain Loc      Pain Edu?      Excl. in [place]?   [id number]     _________________________________________ Significant initial  Findings: Abnormal Labs Reviewed  CBC WITH DIFFERENTIAL/PLATELET - Abnormal; Notable for the following components:      Result Value   WBC 24.4 (*)    RBC 3.79 (*)    Hemoglobin 11.9 (*)    Neutro Abs 22.8 (*)    Lymphs Abs 0.4 (*)    Abs Immature Granulocytes 0.26 (*)    All other components within normal limits  BRAIN NATRIURETIC PEPTIDE - Abnormal; Notable for the following components:  B Natriuretic Peptide 572.0 (*)    All other components within normal limits  LACTIC ACID, PLASMA - Abnormal; Notable for the following components:   Lactic Acid, Venous 3.5 (*)    All other components within normal limits  LACTIC ACID, PLASMA - Abnormal; Notable for the following components:   Lactic Acid, Venous 2.9 (*)    All other components within normal limits  COMPREHENSIVE METABOLIC PANEL - Abnormal; Notable for the following components:   CO2 21 (*)    Glucose, Bld 130 (*)    BUN 50 (*)    Creatinine, Ser 2.03 (*)    Total Protein 6.4 (*)    Albumin 3.0 (*)    AST 42 (*)    GFR, Estimated 22 (*)    All other components within normal limits  TROPONIN I (HIGH SENSITIVITY) - Abnormal; Notable for the following components:   Troponin I (High Sensitivity) 94 (*)    All other components within normal limits  TROPONIN I (HIGH SENSITIVITY) - Abnormal; Notable for the following components:   Troponin I (High Sensitivity) 94 (*)    All other components within normal limits    ____________________ This patient meets SIRS  Criteria and may be septic.     The recent clinical data is shown below. Vitals:   02/17/23 2030 02/17/23 2045 02/17/23 2046 02/17/23 2047  BP: (!) 100/51 97/63    Pulse: (!) 115 (!) 120 (!) 117 (!) 116  Resp:      Temp:      TempSrc:      SpO2: 97% 98% 98% 100%    WBC     Component Value Date/Time   WBC 24.4 (H) 02/17/2023 1506   LYMPHSABS 0.4 (L) 02/17/2023 1506   MONOABS 0.8 02/17/2023 1506   EOSABS 0.2 02/17/2023 1506   BASOSABS 0.0 02/17/2023 1506     Lactic Acid, Venous    Component Value Date/Time   LATICACIDVEN 2.9 (HH) 02/17/2023 1837    Lactic Acid, Venous    Component Value Date/Time   LATICACIDVEN 2.9 ([place]) 02/17/2023 1837      Results for orders placed or performed during the hospital encounter of 09/21/12  Urine culture     Status: None   Collection Time: 09/21/12  3:16 PM   Specimen: Urine, Catheterized  Result Value Ref Range Status   Specimen Description URINE, CATHETERIZED  Final   Special Requests NONE  Final   Culture  Setup Time 09/21/2012 15:55  Final   [place] Count NO GROWTH  Final   Culture NO GROWTH  Final   Report Status 09/22/2012 FINAL  Final   ___________________________ Hospitalist was called for admission for  severe sepsis and cellulitis   The following Work up has been ordered so far:  Orders Placed This Encounter  Procedures   Culture, blood (single)   DG Chest Portable 1 View   CBC with Differential   Brain natriuretic peptide   Lactic acid, plasma   Comprehensive metabolic panel   DO NOT delay antibiotics if unable to obtain blood culture.   Do not attempt resuscitation (DNR)   Code Sepsis activation.  This occurs automatically when order is signed and prioritizes pharmacy, lab, and radiology services for STAT collections and interventions.  If [facility] downtime, call [facility] [phone]) to activate Code Sepsis.   Consult to Transition of Care Team (SW and CM)   Consult to hospitalist   Insert 2nd peripheral IV if not  already present.     OTHER  Significant initial  Findings:  labs showing:    Recent Labs  Lab 02/17/23 1706  NA 136  K 4.0  CO2 21*  GLUCOSE 130*  BUN 50*  CREATININE 2.03*  CALCIUM 9.1    Cr   Up from baseline see below Lab Results  Component Value Date   CREATININE 2.03 (H) 02/17/2023   CREATININE 0.74 12/16/2022   CREATININE 0.76 12/14/2022    Recent Labs  Lab 02/17/23 1706  AST 42*  ALT 24  ALKPHOS 94  BILITOT 1.0  PROT 6.4*  ALBUMIN 3.0*   Lab Results  Component Value Date   CALCIUM 9.1 02/17/2023          Plt: Lab Results  Component Value Date   PLT 219 02/17/2023         Recent Labs  Lab 02/17/23 1506  WBC 24.4*  NEUTROABS 22.8*  HGB 11.9*  HCT 36.5  MCV 96.3  PLT 219    HG/HCT stable,       Component Value Date/Time   HGB 11.9 (L) 02/17/2023 1506   HCT 36.5 02/17/2023 1506   MCV 96.3 02/17/2023 1506      BNP (last 3 results) Recent Labs    02/17/23 1506  BNP 572.0*      Cultures:    Component Value Date/Time   SDES URINE, CATHETERIZED 09/21/2012 1516   SPECREQUEST NONE 09/21/2012 1516   CULT NO GROWTH 09/21/2012 1516   REPTSTATUS 09/22/2012 FINAL 09/21/2012 1516     Radiological Exams on Admission: DG Chest Portable 1 View  Result Date: 02/17/2023 CLINICAL DATA:  Altered mental status EXAM: PORTABLE CHEST 1 VIEW COMPARISON:  Chest x-ray dated December 13, 2022 FINDINGS: Patient rotation to the left somewhat limits evaluation. Cardiac and mediastinal contours are unchanged when accounting for patient rotation. Mitral annular calcifications. No consolidation. No evidence of pleural effusion pneumothorax. Advanced degenerative changes of the bilateral glenohumeral joints. IMPRESSION: No evidence of acute cardiopulmonary disease. Electronically Signed   By: Yetta Glassman M.D.   On: 02/17/2023 15:49   _______________________________________________________________________________________________________ Latest  Blood  pressure 97/63, pulse (!) 116, temperature 97.9 F (36.6 C), temperature source Oral, resp. rate 16, SpO2 100 %.   Vitals  labs and radiology finding personally reviewed  Review of Systems:    Pertinent positives include:   Fevers, chills, fatigue,  Constitutional:  No weight loss, night sweats, weight loss  HEENT:  No headaches, Difficulty swallowing,Tooth/dental problems,Sore throat,  No sneezing, itching, ear ache, nasal congestion, post nasal drip,  Cardio-vascular:  No chest pain, Orthopnea, PND, anasarca, dizziness, palpitations.no Bilateral lower extremity swelling  GI:  No heartburn, indigestion, abdominal pain, nausea, vomiting, diarrhea, change in bowel habits, loss of appetite, melena, blood in stool, hematemesis Resp:  no shortness of breath at rest. No dyspnea on exertion, No excess mucus, no productive cough, No non-productive cough, No coughing up of blood.No change in color of mucus.No wheezing. Skin:  no rash or lesions. No jaundice GU:  no dysuria, change in color of urine, no urgency or frequency. No straining to urinate.  No flank pain.  Musculoskeletal:  No joint pain or no joint swelling. No decreased range of motion. No back pain.  Psych:  No change in mood or affect. No depression or anxiety. No memory loss.  Neuro: no localizing neurological complaints, no tingling, no weakness, no double vision, no gait abnormality, no slurred speech, no confusion  All systems reviewed and apart from [place] all are negative _______________________________________________________________________________________________ Past Medical History:  Past Medical History:  Diagnosis Date   Chronic diastolic heart failure (HCC)    Heart murmur    Hypertension    Osteoporosis       Past Surgical History:  Procedure Laterality Date   CATARACT EXTRACTION      Social History:  Ambulatory  wheelchair bound       reports that she has never smoked. She has never used  smokeless tobacco. She reports that she does not drink alcohol and does not use drugs.   Family History:   Family History  Problem Relation Age of Onset   Dementia Mother    Lung cancer Father    ______________________________________________________________________________________________ Allergies: No Known Allergies   Prior to Admission medications   Medication Sig Start Date End Date Taking? Authorizing Provider  acetaminophen (TYLENOL) 325 MG tablet Take 2 tablets (650 mg total) by mouth 2 (two) times daily. 12/16/22   Shalhoub, Sherryll Burger, MD  acetaminophen (TYLENOL) 325 MG tablet Take 2 tablets (650 mg total) by mouth every 6 (six) hours as needed for mild pain (or Fever >/= 101). 12/16/22   Shalhoub, Sherryll Burger, MD  Calcium Carbonate-Vit D-Min (CALCIUM 1200 PO) Take 1,200 mg by mouth daily.    [provider]  feeding supplement (ENSURE ENLIVE / ENSURE PLUS) LIQD Take 237 mLs by mouth 2 (two) times daily between meals. 12/16/22   Shalhoub, Sherryll Burger, MD  furosemide (LASIX) 40 MG tablet Take 1 tablet (40 mg total) by mouth daily as needed. Weigh patient daily.  If weight increases by more than 2 pounds in one day or 5 pounds in one week take 1 tablet by mouth that day. 12/16/22   Shalhoub, Sherryll Burger, MD  melatonin 10 MG TABS Take 10 mg by mouth at bedtime. 12/16/22   Shalhoub, Sherryll Burger, MD  Multiple Vitamin (MULTIVITAMIN WITH MINERALS) TABS Take 1 tablet by mouth daily.    [provider]  ondansetron (ZOFRAN) 4 MG tablet Take 1 tablet (4 mg total) by mouth every 6 (six) hours as needed for nausea. 12/16/22   Shalhoub, Sherryll Burger, MD  telmisartan (MICARDIS) 80 MG tablet Take 1 tablet (80 mg total) by mouth daily. 01/25/17   Jerline Pain, MD    ___________________________________________________________________________________________________ Physical Exam:    02/17/2023    8:47 PM 02/17/2023    8:46 PM 02/17/2023    8:45 PM  Vitals with BMI  Systolic   97  Diastolic   63   Pulse [id number] 117 120    1. General:  in   Acute distress    Chronically ill   -appearing 2. Psychological: somnolent 3. Head/ENT:   Dry Mucous Membranes                          Head Non traumatic, neck supple                           Poor Dentition 4. SKIN:  decreased Skin turgor,  Skin clean Dry and intact no rash        5. Heart: Regular rate and rhythm systolic Murmur, no Rub or gallop 6. Lungs:   no wheezes or crackles   7. Abdomen: Soft,  non-tender, Non distended bowel sounds present 8. Lower extremities: no clubbing, cyanosis, no  edema 9. Neurologically Grossly intact, moving all 4 extremities equally   10. MSK: Normal range of motion    Chart has  been reviewed  ______________________________________________________________________________________________  Assessment/Plan 89 y.o. female with medical history significant of dementia history of sacral fracture, chronic diastolic CHF, HTN severe mitral regurgitation chronic right hip dislocation multiple falls   Admitted for cellulitis, severe sepsis,    Present on Admission:  Severe sepsis ([place])  Dementia with behavioral disturbance (HCC)  Chronic diastolic heart failure (HCC)  Essential hypertension  AKI (acute kidney injury) ([place])     Dementia with behavioral disturbance (HCC) Chronic expect some sun dowing  Chronic diastolic heart failure ([place]) Avoid over agressive fluid resuscitation, family wants to dc fluids, agree    Essential hypertension Allow permissive htn   Severe sepsis ([place])  -SIRS criteria met with  elevated white blood cell count,       Component Value Date/Time   WBC 24.4 (H) 02/17/2023 1506   LYMPHSABS 0.4 (L) 02/17/2023 1506     tachycardia   ,     RR >20 Today's Vitals   02/17/23 2047 02/17/23 2130 02/17/23 2133 02/17/23 2211  BP:  (!) 111/54    Pulse: (!) 116 (!) 104 100   Resp:      Temp:      TempSrc:      SpO2: 100% 97% 98%   PainSc:    Asleep      The recent  clinical data is shown below. Vitals:   02/17/23 2046 02/17/23 2047 02/17/23 2130 02/17/23 2133  BP:   (!) 111/54   Pulse: (!) 117 (!) 116 (!) 104 100  Resp:      Temp:      TempSrc:      SpO2: 98% 100% 97% 98%     -Most likely source being:  Cellulitis,    Patient meeting criteria for Severe sepsis with    evidence of end organ damage/organ dysfunction such as   Acute Kidney Injury with Cr > 2,  Lab Results  Component Value Date   CREATININE 2.03 (H) 02/17/2023   CREATININE 0.74 12/16/2022    elevated lactic acid >2     Component Value Date/Time   LATICACIDVEN 2.9 ([place]) 02/17/2023 [id number]    acute metabolic encephalopathy      Fluid resuscitation however was limited because  (patient is   comfort care status OR of shared decision making with patient/ guardian, who prefer limited resuscitation with fluid  10:44 PM   AKI (acute kidney injury) ([place]) In the setting of severe sepsis Pt is comfort care only    Other plan as per orders.  DVT prophylaxis:  none comfort care    Code Status:    Code Status: DNR DNR/DNI  comfort care as per family  I had personally discussed CODE STATUS with family   ACP reviewed  Expect hospital death  Family Communication:   Family   at  Bedside  plan of care was discussed   with  Son   Disposition Plan:                              Back to current facility if becomes stable                                      Palliative care    consulted        Consults called: none    Admission status:  ED Disposition     ED  Disposition  Admit   Condition  --   Comment  Hospital Area[facility] [[id number]]  Level of Care: Palliative Care [15]  May admit patient to [facility] or [facility] if equivalent level of care is available:: No  Covid Evaluation: Asymptomatic - no recent exposure (last 10 days) testing not required  [facility]: [id number]  Admitting Physician: Toy Baker [[id number]]   Attending Physician: Toy Baker [id number]  Certification:: I certify this patient will need inpatient services for at least 2 midnights  Estimated Length of Stay: 2            inpatient     I Expect 2 midnight stay secondary to severity of patient's current illness need for inpatient interventions justified by the following:  hemodynamic instability despite optimal treatment (tachycardia )  Severe lab/radiological/exam abnormalities including:    sepsis and extensive comorbidities including:  CHF dementia    That are currently affecting medical management.   I expect  patient to be hospitalized for 2 midnights requiring inpatient medical care.  Patient is at high risk for adverse outcome (such as loss of life or disability) if not treated.  Indication for inpatient stay as follows:  Severe change from baseline regarding mental status Hemodynamic instability despite maximal medical therapy,      Need for IV pain medication     Level of care      medical floor   comfort care    Briasia Flinders 02/17/2023, 10:50 PM    [facility]     after 2 AM please page floor coverage PA If 7AM-7PM, please contact the day team taking care of the patient using [email or web address]   Patient was evaluated in the context of the global COVID-19 pandemic, which necessitated consideration that the patient might be at risk for infection with the SARS-CoV-2 virus that causes COVID-19. Institutional protocols and algorithms that pertain to the evaluation of patients at risk for COVID-19 are in a state of rapid change based on information released by regulatory bodies including the CDC and federal and state organizations. These policies and algorithms were followed during the patient's care.

## 2023-02-18 ENCOUNTER — Other Ambulatory Visit: Payer: Self-pay

## 2023-02-18 ENCOUNTER — Encounter (HOSPITAL_COMMUNITY): Payer: Self-pay | Admitting: Internal Medicine

## 2023-02-18 DIAGNOSIS — A419 Sepsis, unspecified organism: Secondary | ICD-10-CM | POA: Diagnosis not present

## 2023-02-18 DIAGNOSIS — R6520 Severe sepsis without septic shock: Secondary | ICD-10-CM | POA: Diagnosis not present

## 2023-02-18 MED ORDER — HALOPERIDOL LACTATE 2 MG/ML PO CONC: 0.5000 mg | ORAL | Status: DC | PRN

## 2023-02-18 MED ORDER — LORAZEPAM 2 MG/ML PO CONC: 1.0000 mg | ORAL | Status: DC | PRN

## 2023-02-18 MED ORDER — ONDANSETRON HCL 4 MG/2ML IJ SOLN: 4.0000 mg | Freq: Four times a day (QID) | INTRAMUSCULAR | Status: DC | PRN

## 2023-02-18 MED ORDER — ACETAMINOPHEN 650 MG RE SUPP: 650.0000 mg | Freq: Four times a day (QID) | RECTAL | Status: DC | PRN

## 2023-02-18 MED ORDER — BIOTENE DRY MOUTH MT LIQD: 15.0000 mL | OROMUCOSAL | Status: DC | PRN

## 2023-02-18 MED ORDER — GLYCOPYRROLATE 0.2 MG/ML IJ SOLN: 0.2000 mg | INTRAMUSCULAR | Status: DC | PRN

## 2023-02-18 MED ORDER — ONDANSETRON 4 MG PO TBDP: 4.0000 mg | ORAL_TABLET | Freq: Four times a day (QID) | ORAL | Status: DC | PRN

## 2023-02-18 MED ORDER — HALOPERIDOL 0.5 MG PO TABS: 0.5000 mg | ORAL_TABLET | ORAL | Status: DC | PRN

## 2023-02-18 MED ORDER — GLYCOPYRROLATE 1 MG PO TABS: 1.0000 mg | ORAL_TABLET | ORAL | Status: DC | PRN

## 2023-02-18 MED ORDER — SODIUM CHLORIDE 0.9% FLUSH: 3.0000 mL | INTRAVENOUS | Status: DC | PRN

## 2023-02-18 MED ORDER — SODIUM CHLORIDE 0.9 % IV SOLN: 250.0000 mL | INTRAVENOUS | Status: DC | PRN

## 2023-02-18 MED ORDER — LORAZEPAM 2 MG/ML IJ SOLN
1.0000 mg | INTRAMUSCULAR | Status: DC | PRN
  Administered 2023-02-18 – 2023-02-19 (×4): 1 mg via INTRAVENOUS
  Filled 2023-02-18 (×4): qty 1

## 2023-02-18 MED ORDER — ACETAMINOPHEN 325 MG PO TABS: 650.0000 mg | ORAL_TABLET | Freq: Four times a day (QID) | ORAL | Status: DC | PRN

## 2023-02-18 MED ORDER — POLYVINYL ALCOHOL 1.4 % OP SOLN: 1.0000 [drp] | Freq: Four times a day (QID) | OPHTHALMIC | Status: DC | PRN

## 2023-02-18 MED ORDER — SODIUM CHLORIDE 0.9% FLUSH
3.0000 mL | Freq: Two times a day (BID) | INTRAVENOUS | Status: DC
  Administered 2023-02-18 – 2023-02-20 (×7): 3 mL via INTRAVENOUS

## 2023-02-18 MED ORDER — HALOPERIDOL LACTATE 5 MG/ML IJ SOLN: 0.5000 mg | INTRAMUSCULAR | Status: DC | PRN

## 2023-02-18 MED ORDER — LORAZEPAM 1 MG PO TABS: 1.0000 mg | ORAL_TABLET | ORAL | Status: DC | PRN

## 2023-02-18 NOTE — Progress Notes (Signed)
[facility] Progress Note  Patient: Andrea [name]     [id number]  DOA: 02/26/2023   PCP: Leeroy Cha, MD       Brief hospital course: This is a 89 year old female who [facility] in [facility] and has dementia, multiple falls, chronic right hip dislocation,Severe mitral regurgitation, chronic diastolic heart failure, hypertension and presents to the hospital with swelling and redness of the left leg and multiple weeping wounds.  She was diagnosed with cellulitis and had been started on amoxicillin at her facility on the a.m. of 3/14.  In the ED she was noted to have a WBC count of 24.4, heart rate in the 100s and as high as 120, lactic acid of 3.5 and creatinine of 2.03 limited from a baseline of around 0.7.  She was transitioned to comfort care by her son.  Subjective:  The patient is lethargic Assessment and Plan: Principal Problem:   Severe sepsis ([place]) Active Problems:   Dementia with behavioral disturbance (HCC)   Chronic diastolic heart failure (HCC)   Essential hypertension   AKI (acute kidney injury) ([place])   Cellulitis   He appears comfortable and will pass in the hospital.  As needed comfort meds have been ordered including morphine, Haldol, Ativan, Robinul and Zofran.   Code Status: DNR Consultants: None Level of Care: Level of care: Palliative Care Total time on patient care: 25 minutes  Objective:   Vitals:   02/20/2023 2130 02/23/2023 2133 02/18/2023 2346 02/18/23 0000  BP: (!) 111/54  (!) 97/55   Pulse: (!) 104 100 83   Resp:   18   Temp:   99.8 F (37.7 C)   TempSrc:      SpO2: 97% 98% 94%   Weight:    49.1 kg  Height:    4\' 9"  (1.448 m)   Filed Weights   02/18/23 0000  Weight: 49.1 kg   Exam: Sleeping comfortably.  Does not awaken to being called or examined.    CBC: Recent Labs  Lab 02/24/2023 1506  WBC 24.4*  NEUTROABS 22.8*  HGB 11.9*  HCT 36.5  MCV 96.3  PLT [id number]   Basic Metabolic Panel: Recent Labs  Lab  02/06/2023 1706  NA 136  K 4.0  CL 102  CO2 21*  GLUCOSE 130*  BUN 50*  CREATININE 2.03*  CALCIUM 9.1   GFR: Estimated Creatinine Clearance: 10 mL/min (A) (by C-G formula based on SCr of 2.03 mg/dL (H)).  Scheduled Meds:  sodium chloride flush  3 mL Intravenous Q12H   Continuous Infusions:  sodium chloride     Imaging and lab data was personally reviewed DG Chest Portable 1 View  Result Date: 02/28/2023 CLINICAL DATA:  Altered mental status EXAM: PORTABLE CHEST 1 VIEW COMPARISON:  Chest x-ray dated December 13, 2022 FINDINGS: Patient rotation to the left somewhat limits evaluation. Cardiac and mediastinal contours are unchanged when accounting for patient rotation. Mitral annular calcifications. No consolidation. No evidence of pleural effusion pneumothorax. Advanced degenerative changes of the bilateral glenohumeral joints. IMPRESSION: No evidence of acute cardiopulmonary disease. Electronically Signed   By: Yetta Glassman M.D.   On: 02/16/2023 15:49    LOS: 1 day   Author: Debbe Odea  02/18/2023 3:13 PM  To contact [facility]>   Check the care team in [facility] and look for the attending/consulting [place] provider listed  Log into [email or web address] and use [place]'s universal password   Go to> "[facility]"  and find provider  If you still  have difficulty reaching the provider, please page the [facility] (Director on Call) for the Hospitalists listed on amion

## 2023-02-18 NOTE — TOC Initial Note (Signed)
Transition of Care [facility]) - Initial/Assessment Note   Patient Details  Name: Andrea [name] MRN: [id number] Date of Birth: 22-Jun-1920  [facility]) CM/SW Contact:    Sherie Don, LCSW Phone Number: 02/18/2023, 3:01 PM  Clinical Narrative: CSW made multiple attempts to reach Selena with [facility] and spoke with Belinda at the facility to see if patient can return with hospice services, but did not receive a call back or confirmation that patient could return. Per son, as patient is comfort care and in pain, he would prefer that the patient not be transferred at this time. CSW updated hospitalist.                   Expected Discharge Plan:  (TBD) Barriers to Discharge: Other (must enter comment) (Cannot reach anyone at [facility] memory care)  Patient Goals and CMS Choice Patient states their goals for this hospitalization and ongoing recovery are:: Son requested that patient remain in hospital until she expires Choice offered to / list presented to : NA  Expected Discharge Plan and Services In-house Referral: Clinical Social Work Post Acute Care Choice: NA Living arrangements for the past 2 months: [place] memory care)            DME Arranged: N/A DME Agency: NA  Prior Living Arrangements/Services Living arrangements for the past 2 months: [place] [facility] memory care) Lives with:: Facility Resident Patient language and need for interpreter reviewed:: Yes Need for Family Participation in Patient Care: Yes (Comment) Care giver support system in place?: Yes (comment) Criminal Activity/Legal Involvement Pertinent to Current Situation/Hospitalization: No - Comment as needed  Activities of Daily Living Home Assistive Devices/Equipment: Wheelchair ADL Screening (condition at time of admission) Patient's cognitive ability adequate to safely complete daily activities?: No Is the patient deaf or have difficulty  hearing?: No Does the patient have difficulty seeing, even when wearing glasses/contacts?: Yes Does the patient have difficulty concentrating, remembering, or making decisions?: Yes Patient able to express need for assistance with ADLs?: No Does the patient have difficulty dressing or bathing?: Yes Independently performs ADLs?: No Dressing (OT): Needs assistance Is this a change from baseline?: Pre-admission baseline Grooming: Needs assistance Is this a change from baseline?: Pre-admission baseline Feeding: Needs assistance Is this a change from baseline?: Pre-admission baseline Bathing: Needs assistance Is this a change from baseline?: Pre-admission baseline Toileting: Needs assistance Is this a change from baseline?: Pre-admission baseline In/Out Bed: Needs assistance Is this a change from baseline?: Pre-admission baseline Walks in Home: Dependent (wheelchair bound) Is this a change from baseline?: Pre-admission baseline Does the patient have difficulty walking or climbing stairs?: Yes Weakness of Legs: Both Weakness of Arms/Hands: Both  Emotional Assessment Attitude/Demeanor/Rapport: Unable to Assess Affect (typically observed): Unable to Assess Alcohol / Substance Use: Not Applicable Psych Involvement: No (comment)  Admission diagnosis:  Severe sepsis ([place]) [A41.9, R65.20] Sepsis with acute renal failure and septic shock, due to unspecified organism, unspecified acute renal failure type ([place]) [A41.9, R65.21, N17.9] Patient Active Problem List   Diagnosis Date Noted   Severe sepsis ([place]) 02/26/2023   AKI (acute kidney injury) ([place]) 02/10/2023   Cellulitis 03/04/2023   Dementia with behavioral disturbance ([place]) 12/15/2022   Closed fracture of multiple pubic rami, right, initial encounter ([place]) 12/13/2022   Closed sacral fracture ([place]) 12/13/2022   Severe mitral regurgitation 12/25/2013   Chronic diastolic heart failure ([place]) 12/25/2013   Essential hypertension 12/25/2013    PCP:  Leeroy Cha, MD  Pharmacy:   [id number] - [place], [address] [place] [address] Phone: [phone] Fax: [phone]  Social Determinants of Health (SDOH) Social History: SDOH Screenings   Food Insecurity: No Food Insecurity (02/18/2023)  Housing: Low Risk  (02/18/2023)  Transportation Needs: Patient Unable To Answer (02/18/2023)  Utilities: Patient Unable To Answer (02/18/2023)  Tobacco Use: Low Risk  (02/18/2023)   SDOH Interventions:    Readmission Risk Interventions     No data to display

## 2023-02-18 NOTE — Progress Notes (Signed)
  Daily Progress Note   Patient Name: Andrea [name]       Date: 02/18/2023 DOB: 23-May-1920  Age: 89 y.o. MRN#: [id number] Attending Physician: Debbe Odea, MD Primary Care Physician: Leeroy Cha, MD Admit Date: 02/27/2023 Length of Stay: 1 day  Discussed care with primary hospitalist today. Patient/family already decided to focus on comfort care only. Comfort care orders already in place. As per discussions, patient appears comfortable at this time. Hospitalist making adjustments if needed and working to coordinate end of life care here vs a facility. As goals for medical care are determined and symptoms are managed, palliative medicine consult will be discontinued at this time. Please reach out if needed in the future. Thank you.  Chelsea Aus, DO Palliative Care Provider PMT # [phone]

## 2023-02-18 NOTE — Progress Notes (Signed)
Expected to expire in the hospital

## 2023-02-19 DIAGNOSIS — A419 Sepsis, unspecified organism: Secondary | ICD-10-CM | POA: Diagnosis not present

## 2023-02-19 DIAGNOSIS — R6520 Severe sepsis without septic shock: Secondary | ICD-10-CM | POA: Diagnosis not present

## 2023-02-19 NOTE — Plan of Care (Signed)
  Problem: Education: Goal: Knowledge of the prescribed therapeutic regimen will improve Outcome: Progressing   Problem: Coping: Goal: Ability to identify and develop effective coping behavior will improve Outcome: Progressing   Problem: Clinical Measurements: Goal: Quality of life will improve Outcome: Progressing   Problem: Respiratory: Goal: Verbalizations of increased ease of respirations will increase Outcome: Progressing   Problem: Role Relationship: Goal: Family's ability to cope with current situation will improve Outcome: Progressing   Problem: Pain Management: Goal: Satisfaction with pain management regimen will improve Outcome: Progressing   

## 2023-02-19 NOTE — Progress Notes (Signed)
  [facility] Progress Note  Patient: Andrea [name]     [id number]  DOA: 02/19/2023   PCP: Leeroy Cha, MD       Brief hospital course: This is a 89 year old female who lives in morning to assisted living and has dementia, multiple falls, chronic right hip dislocation,Severe mitral regurgitation, chronic diastolic heart failure, hypertension and presents to the hospital with swelling and redness of the left leg and multiple weeping wounds.  She was diagnosed with cellulitis and had been started on amoxicillin at her facility on the a.m. of 3/14.  In the ED she was noted to have a WBC count of 24.4, heart rate in the 100s and as high as 120, lactic acid of 3.5 and creatinine of 2.03 limited from a baseline of around 0.7.  She was transitioned to comfort care by her son.  Subjective:  Non verbal, lethargic  Assessment and Plan: Principal Problem:   Severe sepsis ([place]) Active Problems:   Dementia with behavioral disturbance (HCC)   Chronic diastolic heart failure (HCC)   Essential hypertension   AKI (acute kidney injury) ([place])   Cellulitis   Continues to be sleeping peacefully and will pass in the hospital.  As needed comfort meds have been ordered including morphine, Haldol, Ativan, Robinul and Zofran. Cellulitis is quite severe and extending up the left leg to the abdomen. I spoke with her son who is at the bedside.   Code Status: DNR Consultants: None Level of Care: Level of care: Palliative Care Total time on patient care: 25 minutes  Objective:   Vitals:   02/16/2023 2133 02/20/2023 2346 02/18/23 0000 02/19/23 0506  BP:  (!) 97/55  (!) 90/49  Pulse: 100 83  (!) 110  Resp:  18  20  Temp:  99.8 F (37.7 C)    TempSrc:      SpO2: 98% 94%  (!) 89%  Weight:   49.1 kg   Height:   4\' 9"  (1.448 m)    Filed Weights   02/18/23 0000  Weight: 49.1 kg   Exam: Sleeping comfortably.  Does not awaken to being called or examined.    CBC: Recent Labs  Lab  02/19/2023 1506  WBC 24.4*  NEUTROABS 22.8*  HGB 11.9*  HCT 36.5  MCV 96.3  PLT [id number]    Basic Metabolic Panel: Recent Labs  Lab 03/02/2023 1706  NA 136  K 4.0  CL 102  CO2 21*  GLUCOSE 130*  BUN 50*  CREATININE 2.03*  CALCIUM 9.1    GFR: Estimated Creatinine Clearance: 10 mL/min (A) (by C-G formula based on SCr of 2.03 mg/dL (H)).  Scheduled Meds:  sodium chloride flush  3 mL Intravenous Q12H   Continuous Infusions:  sodium chloride     Imaging and lab data was personally reviewed No results found.  LOS: 2 days   Author: Debbe Odea  02/19/2023 4:44 PM  To contact [facility]>   Check the care team in [facility] and look for the attending/consulting [place] provider listed  Log into [email or web address] and use [place]'s universal password   Go to> "[facility]"  and find provider  If you still have difficulty reaching the provider, please page the [facility] (Director on Call) for the Hospitalists listed on amion

## 2023-02-20 DIAGNOSIS — A419 Sepsis, unspecified organism: Secondary | ICD-10-CM | POA: Diagnosis not present

## 2023-02-20 DIAGNOSIS — R6520 Severe sepsis without septic shock: Secondary | ICD-10-CM | POA: Diagnosis not present

## 2023-02-20 NOTE — Progress Notes (Signed)
  Daily Progress Note   Patient Name: Andrea [name]       Date: 02/20/2023 DOB: 06-Dec-1920  Age: 89 y.o. MRN#: [id number] Attending Physician: Deatra James, MD Primary Care Physician: Leeroy Cha, MD Admit Date: 02/18/2023 Length of Stay: 3 days  Discussed care with primary hospitalist today. PMT consult received for hospice consult. TOC/SW consult already placed and they will be able to assist with hospice referral and coordination. Will cancel consult at this time for PMT. If further needs arise for PMT, please reach out. Thank you.   Chelsea Aus, DO Palliative Care Provider PMT # [phone]

## 2023-02-20 NOTE — Progress Notes (Signed)
[facility] Progress Note  Patient: Andrea [name]     [id number]  DOA: 02/28/2023   PCP: Leeroy Cha, MD       Brief hospital course: This is a 89 year old female who lives in morning to assisted living and has dementia, multiple falls, chronic right hip dislocation,Severe mitral regurgitation, chronic diastolic heart failure, hypertension and presents to the hospital with swelling and redness of the left leg and multiple weeping wounds.  She was diagnosed with cellulitis and had been started on amoxicillin at her facility on the a.m. of 3/14.  In the ED she was noted to have a WBC count of 24.4, heart rate in the 100s and as high as 120, lactic acid of 3.5 and creatinine of 2.03 limited from a baseline of around 0.7.  She was transitioned to comfort care by her son.  Subjective:  Laying in bed comfortably, agonal breathing-moaning to verbal command, Non verbal, lethargic, not withdrawal with tactile stimuli  Blood pressure (!) 113/38, pulse (!) 103, temperature (!) 100.8 F (38.2 C), resp. rate 18, height 4\' 9"  (1.448 m), weight 49.1 kg, SpO2 93 %.   Assessment and Plan: Principal Problem:   Severe sepsis ([name]) Active Problems:   Dementia with behavioral disturbance (HCC)   Chronic diastolic heart failure (HCC)   Essential hypertension   AKI (acute kidney injury) ([name])   Cellulitis  -Continue comfort care  Continues to be sleeping peacefully and will pass in the hospital.  As needed comfort meds have been ordered including morphine, Haldol, Ativan, Robinul and Zofran. Cellulitis is quite severe and extending up the left leg to the abdomen.   Will attempt to reach out to his son who is active POA Dr. Wynelle Cleveland has discussed the scope of care with patient's son yesterday    Code Status: DNR/ DNI Consultants: None Level of Care: Level of care: Palliative Care Total time on patient care: 25 minutes  Objective:   Vitals:   02/12/2023 2346 02/18/23 0000  02/19/23 0506 02/20/23 0502  BP: (!) 97/55  (!) 90/49 (!) 113/38  Pulse: 83  (!) 110 (!) 103  Resp: 18  20 18   Temp: 99.8 F (37.7 C)   (!) 100.8 F (38.2 C)  TempSrc:      SpO2: 94%  (!) 89% 93%  Weight:  49.1 kg    Height:  4\' 9"  (1.448 m)     Filed Weights   02/18/23 0000  Weight: 49.1 kg   Exam: Sleeping comfortably.  Does not awaken to being called or examined.    CBC: Recent Labs  Lab 02/04/2023 1506  WBC 24.4*  NEUTROABS 22.8*  HGB 11.9*  HCT 36.5  MCV 96.3  PLT [id number]   Basic Metabolic Panel: Recent Labs  Lab 02/08/2023 1706  NA 136  K 4.0  CL 102  CO2 21*  GLUCOSE 130*  BUN 50*  CREATININE 2.03*  CALCIUM 9.1   GFR: Estimated Creatinine Clearance: 10 mL/min (A) (by C-G formula based on SCr of 2.03 mg/dL (H)).  Scheduled Meds:  sodium chloride flush  3 mL Intravenous Q12H   Continuous Infusions:  sodium chloride     Imaging and lab data was personally reviewed No results found.  LOS: 3 days   Author: Deatra James MD  02/20/2023 9:34 AM  To contact [facility]>   Check the care team in [facility] and look for the attending/consulting [place] provider listed  Log into [email or web address] and use [place]'s universal password   Go  to> "[facility]"  and find provider  If you still have difficulty reaching the provider, please page the [facility] (Director on Call) for the Hospitalists listed on amion

## 2023-02-20 NOTE — Plan of Care (Signed)
  Problem: Education: Goal: Knowledge of the prescribed therapeutic regimen will improve Outcome: Progressing   Problem: Pain Management: Goal: Satisfaction with pain management regimen will improve Outcome: Progressing   Problem: Education: Goal: Knowledge of General Education information will improve Description: Including pain rating scale, medication(s)/side effects and non-pharmacologic comfort measures Outcome: Progressing

## 2023-02-22 LAB — CULTURE, BLOOD (SINGLE)
Culture: NO GROWTH
Special Requests: ADEQUATE

## 2023-02-23 DIAGNOSIS — I739 Peripheral vascular disease, unspecified: Secondary | ICD-10-CM | POA: Diagnosis not present

## 2023-02-23 DIAGNOSIS — I158 Other secondary hypertension: Secondary | ICD-10-CM | POA: Diagnosis not present

## 2023-03-07 NOTE — Death Summary Note (Signed)
   DEATH SUMMARY   Patient Details  Name: Adylynn Gans MRN: [id number] DOB: [birth year] [id number], Ronie Spies, MD Admission/Discharge Information   Admit Date:  02-28-23  Date of Death: Date of Death: 2023-03-04  Time of Death: Time of Death: 0953  Length of Stay: 4   Principle Cause of death: Sepsis due to cellulitis of the leg  Hospital Diagnoses: Principal Problem:   Severe sepsis ([place]) Active Problems:   Dementia with behavioral disturbance (HCC)   Chronic diastolic heart failure (HCC)   Essential hypertension   AKI (acute kidney injury) ([place])   Cellulitis   Hospital Course: Mrs. Vertz was a 89 y.o. F with hx dementia, living in ALF, HTN, dCHF who presented with swelling and redness of the leg.  In the ER, found to have leukocytosis, tachycardia, lactic acidosis, decreased mentation and renal failure.  After discussion with family, the decision was made to allow a natural death and provide all comfort cares possible.  The patient was admitted and passed in the hospital.          The results of significant diagnostics from this hospitalization (including imaging, microbiology, ancillary and laboratory) are listed below for reference.   Significant Diagnostic Studies: DG Chest Portable 1 View  Result Date: 28-Feb-2023 CLINICAL DATA:  Altered mental status EXAM: PORTABLE CHEST 1 VIEW COMPARISON:  Chest x-ray dated December 13, 2022 FINDINGS: Patient rotation to the left somewhat limits evaluation. Cardiac and mediastinal contours are unchanged when accounting for patient rotation. Mitral annular calcifications. No consolidation. No evidence of pleural effusion pneumothorax. Advanced degenerative changes of the bilateral glenohumeral joints. IMPRESSION: No evidence of acute cardiopulmonary disease. Electronically Signed   By: Yetta Glassman M.D.   On: 02/28/2023 15:49    Microbiology: Recent Results (from the past 240 hour(s))  Culture, blood (single)      Status: None (Preliminary result)   Collection Time: Feb 28, 2023  4:50 PM   Specimen: BLOOD  Result Value Ref Range Status   Specimen Description   Final    BLOOD RIGHT ANTECUBITAL Performed at [facility] [address], [place], [place] [address]    Special Requests   Final    BOTTLES DRAWN AEROBIC AND ANAEROBIC Blood Culture adequate volume Performed at [facility] [address], [place], [place] [address]    Culture   Final    NO GROWTH 4 DAYS Performed at [facility], [place] [address], [place], [place] [address]    Report Status PENDING  Incomplete     Signed: Edwin Dada, MD 2023-03-04

## 2023-03-07 NOTE — Plan of Care (Signed)
  Problem: Role Relationship: Goal: Family's ability to cope with current situation will improve Outcome: Progressing   Problem: Pain Management: Goal: Satisfaction with pain management regimen will improve Outcome: Progressing   

## 2023-03-07 NOTE — TOC Transition Note (Signed)
Transition of Care [facility]) - CM/SW Discharge Note  Patient Details  Name: Andrea [name] MRN: [id number] Date of Birth: Aug 22, 1920  Transition of Care [facility]) CM/SW Contact:  Sherie Don, LCSW Phone Number: 2023/03/17, 10:21 AM  Clinical Narrative: TOC consulted for residential hospice, but patient is expected to be a hospital death.  Final next level of care: Other (comment) (Anticipating hospital death) Barriers to Discharge: Other (must enter comment) (Anticipating hospital death)  Patient Goals and CMS Choice Choice offered to / list presented to : NA  Discharge Plan and Services Additional resources added to the After Visit Summary for   In-house Referral: Clinical Social Work Post Acute Care Choice: NA          DME Arranged: N/A DME Agency: NA  Social Determinants of Health (SDOH) Interventions SDOH Screenings   Food Insecurity: No Food Insecurity (02/18/2023)  Housing: Low Risk  (02/18/2023)  Transportation Needs: Patient Unable To Answer (02/18/2023)  Utilities: Patient Unable To Answer (02/18/2023)  Tobacco Use: Low Risk  (02/18/2023)   Readmission Risk Interventions     No data to display

## 2023-03-07 DEATH — deceased
